# Patient Record
Sex: Female | Born: 1945 | Race: White | Hispanic: No | Marital: Single | Smoking: Former smoker
Health system: Southern US, Community
[De-identification: ages and names within clinical notes are randomized; demographics above are authoritative.]

## PROBLEM LIST (undated history)

## (undated) DIAGNOSIS — J449 Chronic obstructive pulmonary disease, unspecified: Secondary | ICD-10-CM

## (undated) DIAGNOSIS — I2510 Atherosclerotic heart disease of native coronary artery without angina pectoris: Secondary | ICD-10-CM

## (undated) DIAGNOSIS — I10 Essential (primary) hypertension: Secondary | ICD-10-CM

## (undated) DIAGNOSIS — I5020 Unspecified systolic (congestive) heart failure: Secondary | ICD-10-CM

## (undated) HISTORY — PX: PERCUTANEOUS CORONARY STENT INTERVENTION (PCI-S): SHX6016

---

## 2012-01-27 DIAGNOSIS — I619 Nontraumatic intracerebral hemorrhage, unspecified: Secondary | ICD-10-CM

## 2012-01-27 HISTORY — DX: Nontraumatic intracerebral hemorrhage, unspecified: I61.9

## 2020-10-26 DIAGNOSIS — I213 ST elevation (STEMI) myocardial infarction of unspecified site: Secondary | ICD-10-CM

## 2020-10-26 HISTORY — DX: ST elevation (STEMI) myocardial infarction of unspecified site: I21.3

## 2021-01-26 DIAGNOSIS — I480 Paroxysmal atrial fibrillation: Secondary | ICD-10-CM

## 2021-01-26 HISTORY — DX: Paroxysmal atrial fibrillation: I48.0

## 2021-02-26 DIAGNOSIS — R040 Epistaxis: Secondary | ICD-10-CM

## 2021-02-26 HISTORY — DX: Epistaxis: R04.0

## 2021-05-25 ENCOUNTER — Other Ambulatory Visit: Payer: Self-pay

## 2021-05-25 ENCOUNTER — Inpatient Hospital Stay (HOSPITAL_COMMUNITY)
Admission: EM | Admit: 2021-05-25 | Discharge: 2021-05-29 | DRG: 193 | Disposition: A | Discharge status: Home / Self Care | Payer: Medicare (Managed Care) | Attending: Internal Medicine | Admitting: Internal Medicine

## 2021-05-25 ENCOUNTER — Encounter (HOSPITAL_COMMUNITY): Payer: Self-pay | Admitting: Emergency Medicine

## 2021-05-25 ENCOUNTER — Emergency Department (HOSPITAL_COMMUNITY): Payer: Medicare (Managed Care)

## 2021-05-25 DIAGNOSIS — R5382 Chronic fatigue, unspecified: Secondary | ICD-10-CM | POA: Diagnosis present

## 2021-05-25 DIAGNOSIS — Z8673 Personal history of transient ischemic attack (TIA), and cerebral infarction without residual deficits: Secondary | ICD-10-CM

## 2021-05-25 DIAGNOSIS — Z9861 Coronary angioplasty status: Secondary | ICD-10-CM | POA: Diagnosis not present

## 2021-05-25 DIAGNOSIS — R911 Solitary pulmonary nodule: Secondary | ICD-10-CM | POA: Diagnosis present

## 2021-05-25 DIAGNOSIS — Z8249 Family history of ischemic heart disease and other diseases of the circulatory system: Secondary | ICD-10-CM

## 2021-05-25 DIAGNOSIS — Z87891 Personal history of nicotine dependence: Secondary | ICD-10-CM

## 2021-05-25 DIAGNOSIS — I2510 Atherosclerotic heart disease of native coronary artery without angina pectoris: Secondary | ICD-10-CM | POA: Diagnosis not present

## 2021-05-25 DIAGNOSIS — R0902 Hypoxemia: Secondary | ICD-10-CM | POA: Diagnosis not present

## 2021-05-25 DIAGNOSIS — J189 Pneumonia, unspecified organism: Principal | ICD-10-CM | POA: Diagnosis present

## 2021-05-25 DIAGNOSIS — E785 Hyperlipidemia, unspecified: Secondary | ICD-10-CM | POA: Diagnosis present

## 2021-05-25 DIAGNOSIS — Z79899 Other long term (current) drug therapy: Secondary | ICD-10-CM

## 2021-05-25 DIAGNOSIS — I509 Heart failure, unspecified: Principal | ICD-10-CM

## 2021-05-25 DIAGNOSIS — I252 Old myocardial infarction: Secondary | ICD-10-CM

## 2021-05-25 DIAGNOSIS — I480 Paroxysmal atrial fibrillation: Secondary | ICD-10-CM | POA: Diagnosis present

## 2021-05-25 DIAGNOSIS — I5033 Acute on chronic diastolic (congestive) heart failure: Secondary | ICD-10-CM | POA: Diagnosis present

## 2021-05-25 DIAGNOSIS — I959 Hypotension, unspecified: Secondary | ICD-10-CM | POA: Diagnosis not present

## 2021-05-25 DIAGNOSIS — I10 Essential (primary) hypertension: Secondary | ICD-10-CM | POA: Diagnosis present

## 2021-05-25 DIAGNOSIS — Z20822 Contact with and (suspected) exposure to covid-19: Secondary | ICD-10-CM | POA: Diagnosis present

## 2021-05-25 DIAGNOSIS — I429 Cardiomyopathy, unspecified: Secondary | ICD-10-CM | POA: Diagnosis present

## 2021-05-25 DIAGNOSIS — R8271 Bacteriuria: Secondary | ICD-10-CM | POA: Diagnosis present

## 2021-05-25 DIAGNOSIS — R54 Age-related physical debility: Secondary | ICD-10-CM | POA: Diagnosis present

## 2021-05-25 DIAGNOSIS — I110 Hypertensive heart disease with heart failure: Secondary | ICD-10-CM | POA: Diagnosis present

## 2021-05-25 DIAGNOSIS — Z7902 Long term (current) use of antithrombotics/antiplatelets: Secondary | ICD-10-CM

## 2021-05-25 DIAGNOSIS — Z888 Allergy status to other drugs, medicaments and biological substances status: Secondary | ICD-10-CM

## 2021-05-25 DIAGNOSIS — J440 Chronic obstructive pulmonary disease with acute lower respiratory infection: Secondary | ICD-10-CM | POA: Diagnosis present

## 2021-05-25 DIAGNOSIS — I4891 Unspecified atrial fibrillation: Secondary | ICD-10-CM

## 2021-05-25 DIAGNOSIS — J9601 Acute respiratory failure with hypoxia: Secondary | ICD-10-CM | POA: Diagnosis present

## 2021-05-25 DIAGNOSIS — Z7901 Long term (current) use of anticoagulants: Secondary | ICD-10-CM

## 2021-05-25 DIAGNOSIS — E860 Dehydration: Secondary | ICD-10-CM | POA: Diagnosis present

## 2021-05-25 DIAGNOSIS — R040 Epistaxis: Secondary | ICD-10-CM | POA: Diagnosis present

## 2021-05-25 DIAGNOSIS — Z955 Presence of coronary angioplasty implant and graft: Secondary | ICD-10-CM

## 2021-05-25 HISTORY — DX: Atherosclerotic heart disease of native coronary artery without angina pectoris: I25.10

## 2021-05-25 HISTORY — DX: Unspecified systolic (congestive) heart failure: I50.20

## 2021-05-25 HISTORY — DX: Essential (primary) hypertension: I10

## 2021-05-25 HISTORY — DX: Chronic obstructive pulmonary disease, unspecified: J44.9

## 2021-05-25 LAB — CBC
HCT: 46 % (ref 36.0–46.0)
Hemoglobin: 15.7 g/dL — ABNORMAL HIGH (ref 12.0–15.0)
MCH: 33.1 pg (ref 26.0–34.0)
MCHC: 34.1 g/dL (ref 30.0–36.0)
MCV: 97 fL (ref 80.0–100.0)
Platelets: 240 10*3/uL (ref 150–400)
RBC: 4.74 MIL/uL (ref 3.87–5.11)
RDW: 12.5 % (ref 11.5–15.5)
WBC: 16 10*3/uL — ABNORMAL HIGH (ref 4.0–10.5)
nRBC: 0 % (ref 0.0–0.2)

## 2021-05-25 LAB — URINALYSIS, ROUTINE W REFLEX MICROSCOPIC
Bilirubin Urine: NEGATIVE
Glucose, UA: NEGATIVE mg/dL
Hgb urine dipstick: NEGATIVE
Ketones, ur: NEGATIVE mg/dL
Nitrite: POSITIVE — AB
Protein, ur: 100 mg/dL — AB
Specific Gravity, Urine: 1.019 (ref 1.005–1.030)
WBC, UA: >50 WBC/hpf — ABNORMAL HIGH (ref 0–5)
pH: 8 (ref 5.0–8.0)

## 2021-05-25 LAB — I-STAT ARTERIAL BLOOD GAS, ED
Acid-base deficit: 1 mmol/L (ref 0.0–2.0)
Bicarbonate: 22.3 mmol/L (ref 20.0–28.0)
Calcium, Ion: 1.17 mmol/L (ref 1.15–1.40)
HCT: 45 % (ref 36.0–46.0)
Hemoglobin: 15.3 g/dL — ABNORMAL HIGH (ref 12.0–15.0)
O2 Saturation: 93 %
Patient temperature: 98.6
Potassium: 3.4 mmol/L — ABNORMAL LOW (ref 3.5–5.1)
Sodium: 134 mmol/L — ABNORMAL LOW (ref 135–145)
TCO2: 23 mmol/L (ref 22–32)
pCO2 arterial: 32 mmHg (ref 32–48)
pH, Arterial: 7.451 — ABNORMAL HIGH (ref 7.35–7.45)
pO2, Arterial: 62 mmHg — ABNORMAL LOW (ref 83–108)

## 2021-05-25 LAB — BRAIN NATRIURETIC PEPTIDE: B Natriuretic Peptide: 872.4 pg/mL — ABNORMAL HIGH (ref 0.0–100.0)

## 2021-05-25 LAB — TROPONIN I (HIGH SENSITIVITY)
Troponin I (High Sensitivity): 11 ng/L (ref <18)
Troponin I (High Sensitivity): 12 ng/L (ref <18)

## 2021-05-25 LAB — HEPATIC FUNCTION PANEL
ALT: 19 U/L (ref 0–44)
AST: 29 U/L (ref 15–41)
Albumin: 3.9 g/dL (ref 3.5–5.0)
Alkaline Phosphatase: 65 U/L (ref 38–126)
Bilirubin, Direct: 0.3 mg/dL — ABNORMAL HIGH (ref 0.0–0.2)
Indirect Bilirubin: 1.3 mg/dL — ABNORMAL HIGH (ref 0.3–0.9)
Total Bilirubin: 1.6 mg/dL — ABNORMAL HIGH (ref 0.3–1.2)
Total Protein: 7.1 g/dL (ref 6.5–8.1)

## 2021-05-25 LAB — BASIC METABOLIC PANEL
Anion gap: 10 (ref 5–15)
BUN: 15 mg/dL (ref 8–23)
CO2: 21 mmol/L — ABNORMAL LOW (ref 22–32)
Calcium: 9 mg/dL (ref 8.9–10.3)
Chloride: 102 mmol/L (ref 98–111)
Creatinine, Ser: 0.86 mg/dL (ref 0.44–1.00)
GFR, Estimated: >60 mL/min (ref >60)
Glucose, Bld: 170 mg/dL — ABNORMAL HIGH (ref 70–99)
Potassium: 3.8 mmol/L (ref 3.5–5.1)
Sodium: 133 mmol/L — ABNORMAL LOW (ref 135–145)

## 2021-05-25 MED ORDER — DILTIAZEM HCL-DEXTROSE 125-5 MG/125ML-% IV SOLN (PREMIX)
5.0000 mg/h | INTRAVENOUS | Status: DC
  Administered 2021-05-25 – 2021-05-26 (×2): 5 mg/h via INTRAVENOUS
  Filled 2021-05-25 (×2): qty 125

## 2021-05-25 MED ORDER — FUROSEMIDE 10 MG/ML IJ SOLN
20.0000 mg | Freq: Once | INTRAMUSCULAR | Status: AC
  Administered 2021-05-25: 20 mg via INTRAVENOUS
  Filled 2021-05-25: qty 2

## 2021-05-25 MED ORDER — SODIUM CHLORIDE 0.9 % IV SOLN
1.0000 g | INTRAVENOUS | Status: DC
  Administered 2021-05-25: 1 g via INTRAVENOUS
  Filled 2021-05-25: qty 10

## 2021-05-25 MED ORDER — SODIUM CHLORIDE 0.9 % IV SOLN: 250.0000 mL | INTRAVENOUS | Status: DC | PRN

## 2021-05-25 MED ORDER — SODIUM CHLORIDE 0.9% FLUSH: 3.0000 mL | INTRAVENOUS | Status: DC | PRN

## 2021-05-25 MED ORDER — APIXABAN 5 MG PO TABS
5.0000 mg | ORAL_TABLET | Freq: Two times a day (BID) | ORAL | Status: DC
  Administered 2021-05-26 – 2021-05-29 (×8): 5 mg via ORAL
  Filled 2021-05-25 (×8): qty 1

## 2021-05-25 MED ORDER — CLOPIDOGREL BISULFATE 75 MG PO TABS
75.0000 mg | ORAL_TABLET | Freq: Every day | ORAL | Status: DC
  Administered 2021-05-26 – 2021-05-29 (×4): 75 mg via ORAL
  Filled 2021-05-25 (×4): qty 1

## 2021-05-25 MED ORDER — ONDANSETRON HCL 4 MG/2ML IJ SOLN: 4.0000 mg | Freq: Four times a day (QID) | INTRAMUSCULAR | Status: DC | PRN

## 2021-05-25 MED ORDER — FUROSEMIDE 10 MG/ML IJ SOLN
20.0000 mg | Freq: Every day | INTRAMUSCULAR | Status: DC
  Filled 2021-05-25: qty 2

## 2021-05-25 MED ORDER — POLYVINYL ALCOHOL 1.4 % OP SOLN: 1.0000 [drp] | Freq: Two times a day (BID) | OPHTHALMIC | Status: DC | PRN

## 2021-05-25 MED ORDER — ACETAMINOPHEN 325 MG PO TABS: 650.0000 mg | ORAL_TABLET | ORAL | Status: DC | PRN

## 2021-05-25 MED ORDER — IRBESARTAN 150 MG PO TABS: 150.0000 mg | ORAL_TABLET | Freq: Two times a day (BID) | ORAL | Status: DC

## 2021-05-25 MED ORDER — IPRATROPIUM-ALBUTEROL 0.5-2.5 (3) MG/3ML IN SOLN
3.0000 mL | Freq: Once | RESPIRATORY_TRACT | Status: AC
  Administered 2021-05-25: 3 mL via RESPIRATORY_TRACT
  Filled 2021-05-25: qty 3

## 2021-05-25 MED ORDER — METHYLPREDNISOLONE SODIUM SUCC 125 MG IJ SOLR
125.0000 mg | Freq: Once | INTRAMUSCULAR | Status: AC
  Administered 2021-05-25: 125 mg via INTRAVENOUS
  Filled 2021-05-25: qty 2

## 2021-05-25 MED ORDER — METOPROLOL SUCCINATE ER 25 MG PO TB24: 100.0000 mg | ORAL_TABLET | Freq: Every day | ORAL | Status: DC

## 2021-05-25 MED ORDER — ALBUTEROL SULFATE (2.5 MG/3ML) 0.083% IN NEBU: 2.5000 mg | INHALATION_SOLUTION | Freq: Four times a day (QID) | RESPIRATORY_TRACT | Status: DC | PRN

## 2021-05-25 MED ORDER — FUROSEMIDE 10 MG/ML IJ SOLN
20.0000 mg | Freq: Once | INTRAMUSCULAR | Status: DC
Start: 2021-05-26 — End: 2021-05-26

## 2021-05-25 MED ORDER — SODIUM CHLORIDE 0.9% FLUSH
3.0000 mL | Freq: Two times a day (BID) | INTRAVENOUS | Status: DC
  Administered 2021-05-25 – 2021-05-29 (×6): 3 mL via INTRAVENOUS

## 2021-05-25 MED ORDER — DILTIAZEM HCL 25 MG/5ML IV SOLN
15.0000 mg | Freq: Once | INTRAVENOUS | Status: DC
Start: 2021-05-25 — End: 2021-05-25
  Filled 2021-05-25: qty 5

## 2021-05-25 MED ORDER — ATORVASTATIN CALCIUM 40 MG PO TABS
40.0000 mg | ORAL_TABLET | Freq: Every evening | ORAL | Status: DC
  Administered 2021-05-26 – 2021-05-28 (×3): 40 mg via ORAL
  Filled 2021-05-25 (×3): qty 1

## 2021-05-25 MED ORDER — METOPROLOL SUCCINATE ER 25 MG PO TB24: 100.0000 mg | ORAL_TABLET | Freq: Two times a day (BID) | ORAL | Status: DC

## 2021-05-25 NOTE — Assessment & Plan Note (Addendum)
DDx = CHF vs PNA, CXR neg for PNA findings though. ?Pt with h/o HFrEF following STEMI and PCI in Oct, had recovered EF to 55% with medical management as of Feb though.  Unclear if EF reduced again today. ?1. CHF pathway ?2. Lasix 20mg  IV x1 in ED, then 20mg  IV daily (increase depending on response). ?3. Checking ABG due to pt being very sleepy on exam, need to r/o hypercapnea ?4. Tele monitor ?5. 2d echo ordered ?6. Cont Losartan ?7. Cont BB ?

## 2021-05-25 NOTE — ED Notes (Signed)
The pt did not do well on the hhn  maybe half went in ?

## 2021-05-25 NOTE — H&P (Addendum)
?History and Physical  ? ? ?Patient: Jill Baldwin WLN:[id number] DOB: [birth year] ?DOA: 05/25/2021 ?DOS: the patient was seen and examined on 05/25/2021 ?PCP: System, Provider Not In  ?Patient coming from: Home ? ?Chief Complaint:  ?Chief Complaint  ?Patient presents with  ? Shortness of Breath  ? ?HPI: Jill Baldwin is a 76 y.o. female with medical history significant of ICH in 2014, COPD. ? ?Pt had STEMI in Oct 2022 -> PCI to RCA.  Initially reduced EF 35-40%. ? ?New onset A.Fib RVR in Jan 2023, put on rate control meds with eliquis. ? ?Epistaxis in Feb, remains on eliquis at this time. ? ?Pt with chronic fatigue, though her EF did recover with maximal medical therapy, most recently was 55% in Feb. ? ?Pt in to ED with c/o SOB onset today.  Sats 70s at home.  No CP.  Forgot to take her meds this AM. ? ?Has cough and fatigue for past couple of days.  Chills but no fever.  No sick contacts.  No sore throat, nasal congestion. ? ? ?Review of Systems: As mentioned in the history of present illness. All other systems reviewed and are negative. ?Past Medical History:  ?Diagnosis Date  ? CAD S/P percutaneous coronary angioplasty   ? COPD, group C, by GOLD 2017 classification (HCC)   ? Epistaxis 02/2021  ? HFrEF (heart failure with reduced ejection fraction) (HCC)   ? EF reduced after STEMI, eventually recovered to 55% as of Feb 2023  ? HTN (hypertension)   ? ICH (intracerebral hemorrhage) (HCC) 2014  ? PAF (paroxysmal atrial fibrillation) (HCC) 01/2021  ? With RVR  ? STEMI (ST elevation myocardial infarction) (HCC) 10/2020  ? PCI to RCA  ? ?Past Surgical History:  ?Procedure Laterality Date  ? PERCUTANEOUS CORONARY STENT INTERVENTION (PCI-S)    ? Oct 2022  ? ?Social History:  reports that she quit smoking about 15 years ago. Her smoking use included cigarettes. She has a 20.00 pack-year smoking history. She does not have any smokeless tobacco history on file. She reports current alcohol use of about 1.0 standard drink per week.  She reports that she does not use drugs. ? ?Allergies  ?Allergen Reactions  ? Ticagrelor Shortness Of Breath  ?  Other reaction(s): Shortness of Breath ?  ? Ace Inhibitors   ?  cough ?cough ?  ? Hydrochlorothiazide   ?  depleted all her potassium and magnesium  ?depleted all her potassium and magnesium  ?  ? ? ?History reviewed. No pertinent family history. ? ?Prior to Admission medications   ?Medication Sig Start Date End Date Taking? Authorizing Provider  ?albuterol (VENTOLIN HFA) 108 (90 Base) MCG/ACT inhaler Inhale 2 puffs into the lungs every 6 (six) hours as needed for wheezing or shortness of breath. 05/20/21  Yes [provider]  ?amLODipine (NORVASC) 5 MG tablet Take 5 mg by mouth daily. 04/30/21  Yes [provider]  ?apixaban (ELIQUIS) 5 MG TABS tablet Take 5 mg by mouth in the morning and at bedtime. 11/07/20  Yes [provider]  ?atorvastatin (LIPITOR) 40 MG tablet Take 40 mg by mouth every evening. 03/19/21  Yes [provider]  ?Carboxymethylcellulose Sodium (REFRESH TEARS OP) Place 1 drop into both eyes 2 (two) times daily as needed (dry eyes).   Yes [provider]  ?clopidogrel (PLAVIX) 75 MG tablet Take 75 mg by mouth daily. 12/06/20  Yes [provider]  ?metoprolol succinate (TOPROL-XL) 100 MG 24 hr tablet Take 100 mg by mouth  in the morning and at bedtime. 11/07/20  Yes [provider]  ?Multiple Vitamins-Minerals (CENTRUM SILVER PO) Take 1 tablet by mouth daily.   Yes [provider]  ?valsartan (DIOVAN) 160 MG tablet Take 160 mg by mouth in the morning and at bedtime. 01/24/21  Yes [provider]  ? ? ?Physical Exam: ?Vitals:  ? 05/25/21 1900 05/25/21 2044 05/25/21 2115 05/25/21 2200  ?BP: (!) 166/122  (!) 172/158 100/64  ?Pulse: 90 (!) 135 (!) 152 (!) 147  ?Resp: (!) 23 (!) 28 (!) 27 (!) 29  ?Temp:      ?TempSrc:      ?SpO2: 92% 93% 92% (!) 89%  ?Weight:      ?Height:      ? ?Constitutional: Elderly, frail ?Eyes:  PERRL, lids and conjunctivae normal ?ENMT: Mucous membranes are moist. Posterior pharynx clear of any exudate or lesions.Normal dentition.  ?Neck: normal, supple, no masses, no thyromegaly ?Respiratory: Diffuse Rhonchi and rales ?Cardiovascular: Tachycardia, irr/irr, 1+ BLE edema ?Abdomen: no tenderness, no masses palpated. No hepatosplenomegaly. Bowel sounds positive.  ?Musculoskeletal: no clubbing / cyanosis. No joint deformity upper and lower extremities. Good ROM, no contractures. Normal muscle tone.  ?Skin: no rashes, lesions, ulcers. No induration ?Neurologic: CN 2-12 grossly intact. Sensation intact, DTR normal. Strength 5/5 in all 4.  ?Psychiatric: Normal judgment and insight. Alert and oriented x 3. Normal mood.  ? ?Data Reviewed: ? ?CXR showing cardiomegaly, pulmonary vascular congestion, no focal infiltrate. ? ?WBC 16k ? ?BNP 800 ? ?Urinalysis ?   ?Component Value Date/Time  ? COLORURINE AMBER (A) 05/25/2021 1935  ? APPEARANCEUR TURBID (A) 05/25/2021 1935  ? LABSPEC 1.019 05/25/2021 1935  ? PHURINE 8.0 05/25/2021 1935  ? GLUCOSEU NEGATIVE 05/25/2021 1935  ? HGBUR NEGATIVE 05/25/2021 1935  ? BILIRUBINUR NEGATIVE 05/25/2021 1935  ? Lavenia Atlas NEGATIVE 05/25/2021 1935  ? PROTEINUR 100 (A) 05/25/2021 1935  ? NITRITE POSITIVE (A) 05/25/2021 1935  ? LEUKOCYTESUR LARGE (A) 05/25/2021 1935  ? ? ? ? ?Assessment and Plan: ?* Acute respiratory failure with hypoxia (HCC) ?DDx = CHF vs PNA, CXR neg for PNA findings though. ?Pt with h/o HFrEF following STEMI and PCI in Oct, had recovered EF to 55% with medical management as of Feb though.  Unclear if EF reduced again today. ?CHF pathway ?Lasix 20mg  IV x1 in ED, then 20mg  IV daily (increase depending on response). ?Checking ABG due to pt being very sleepy on exam, need to r/o hypercapnea ?Tele monitor ?2d echo ordered ?Cont Losartan ?Cont BB ? ?Paroxysmal atrial fibrillation with RVR (HCC) ?Cont BB - Note: does look like she is prescribed the Metoprolol 100mg  24h tab as  BID dosing, confirmed on review of her most recent cardiology office note on 2/21 ?Cont Eliquis ?Cardizem gtt for rate control ?Tele monitor ? ?CAD S/P percutaneous coronary angioplasty ?Trop neg today ?Continue plavix / eliquis ?Continue statin ? ?HTN (hypertension) ?Cont BB ?Cont Losartan ?Holding norvasc, using cardizem instead given A.Fib RVR today ? ? ? ? ? Advance Care Planning:   Code Status: Full Code ? ?Consults: None ? ?Family Communication: No family in room ? ?Severity of Illness: ?The appropriate patient status for this patient is OBSERVATION. Observation status is judged to be reasonable and necessary in order to provide the required intensity of service to ensure the patient's safety. The patient's presenting symptoms, physical exam findings, and initial radiographic and laboratory data in the context of their medical condition is felt to place them at decreased risk for further clinical  deterioration. Furthermore, it is anticipated that the patient will be medically stable for discharge from the hospital within 2 midnights of admission.  ? ?Author: ?Hillary BowGARDNER, Claudeen Leason M., DO ?05/25/2021 11:11 PM ? ?For on call review [email or web address].  ?

## 2021-05-25 NOTE — Assessment & Plan Note (Addendum)
1. Cont BB - Note: does look like she is prescribed the Metoprolol 100mg  24h tab as BID dosing, confirmed on review of her most recent cardiology office note on 2/21 ?2. Cont Eliquis ?3. Cardizem gtt for rate control ?4. Tele monitor ?

## 2021-05-25 NOTE — ED Notes (Signed)
THE PT IS C/O MUCH PAIN IN HER RT ABD SUDDENLY ?

## 2021-05-25 NOTE — ED Notes (Signed)
The opt has chronic af and she forgot to take her medicine this am ?

## 2021-05-25 NOTE — ED Notes (Signed)
The pt has intermittent  rt lateral abd pain lasting seconds ?

## 2021-05-25 NOTE — Assessment & Plan Note (Signed)
1. Cont BB ?2. Cont Losartan ?3. Holding norvasc, using cardizem instead given A.Fib RVR today ?

## 2021-05-25 NOTE — ED Notes (Signed)
Patient is on 6L O2 and oxygen saturation level is 90-91%. Notified Dr. Julian Reil. ?

## 2021-05-25 NOTE — Assessment & Plan Note (Signed)
Trop neg today ?1. Continue plavix / eliquis ?2. Continue statin ?

## 2021-05-25 NOTE — ED Provider Notes (Signed)
?[facility] ?Provider Note ? ? ?CSN: [id number] ?Arrival date & time: 05/25/21  1556 ? ?  ? ?History ? ?Chief Complaint  ?Patient presents with  ? Shortness of Breath  ? ? ?Jill Baldwin is a 76 y.o. female. ? ?HPI ? ?76 year old female presents to the emergency department with productive cough and fatigue.  Patient states has been going on for the past couple days, worse today.  Cough is productive of green phlegm.  No hemoptysis.  Was originally seen in urgent care, noted to be hypoxic.  Sent here for further evaluation.  No known sick contacts.  She endorses chills without fever.  No swelling of her lower extremities.  No active chest/back pain.  Denies any sore throat, nasal congestion, abdominal pain, vomiting/diarrhea.  She does have an inhaler that she has been using without relief. ? ?Home Medications ?Prior to Admission medications   ?Not on File  ?   ? ?Allergies    ?Patient has no known allergies.   ? ?Review of Systems   ?Review of Systems  ?Constitutional:  Positive for chills and fatigue. Negative for fever.  ?Respiratory:  Positive for cough, shortness of breath and wheezing.   ?Cardiovascular:  Negative for chest pain, palpitations and leg swelling.  ?Gastrointestinal:  Negative for abdominal pain, diarrhea and vomiting.  ?Genitourinary:  Negative for flank pain.  ?Musculoskeletal:  Negative for back pain.  ?Skin:  Negative for rash.  ?Neurological:  Negative for headaches.  ? ?Physical Exam ?Updated Vital Signs ?BP 132/84   Pulse 93   Temp 98.6 ?F (37 ?C) (Oral)   Resp 20   SpO2 100%  ?Physical Exam ?Vitals and nursing note reviewed.  ?Constitutional:   ?   Appearance: Normal appearance. She is ill-appearing.  ?HENT:  ?   Head: Normocephalic.  ?   Mouth/Throat:  ?   Mouth: Mucous membranes are moist.  ?Cardiovascular:  ?   Rate and Rhythm: Normal rate.  ?Pulmonary:  ?   Effort: Pulmonary effort is normal. Tachypnea present. No respiratory distress.  ?   Breath  sounds: Examination of the right-upper field reveals rales. Examination of the right-middle field reveals rales. Examination of the right-lower field reveals decreased breath sounds. Examination of the left-lower field reveals decreased breath sounds and rales. Decreased breath sounds, wheezing and rales present.  ?Chest:  ?   Chest wall: No crepitus.  ?Abdominal:  ?   Palpations: Abdomen is soft.  ?   Tenderness: There is no abdominal tenderness.  ?Musculoskeletal:  ?   Right lower leg: No edema.  ?   Left lower leg: No edema.  ?Skin: ?   General: Skin is warm.  ?Neurological:  ?   Mental Status: She is alert and oriented to person, place, and time. Mental status is at baseline.  ?Psychiatric:     ?   Mood and Affect: Mood normal.  ? ? ?ED Results / Procedures / Treatments   ?Labs ?(all labs ordered are listed, but only abnormal results are displayed) ?Labs Reviewed  ?BASIC METABOLIC PANEL  ?CBC  ?BRAIN NATRIURETIC PEPTIDE  ?HEPATIC FUNCTION PANEL  ?TROPONIN I (HIGH SENSITIVITY)  ? ? ?EKG ?None ? ?Radiology ?No results found. ? ?Procedures ?Marland KitchenCritical Care ?Performed by: Lorelle Gibbs, DO ?Authorized by: Lorelle Gibbs, DO  ? ?Critical care provider statement:  ?  Critical care time (minutes):  60 ?  Critical care time was exclusive of:  Separately billable procedures and treating other patients ?  Critical care was necessary to treat or prevent imminent or life-threatening deterioration of the following conditions:  Respiratory failure and cardiac failure ?  Critical care was time spent personally by me on the following activities:  Development of treatment plan with patient or surrogate, discussions with consultants, evaluation of patient's response to treatment, examination of patient, ordering and review of laboratory studies, ordering and review of radiographic studies, ordering and performing treatments and interventions, pulse oximetry, re-evaluation of patient's condition and review of old charts ?  I  assumed direction of critical care for this patient from another provider in my specialty: no   ?  Care discussed with: admitting provider    ? ? ?Medications Ordered in ED ?Medications  ?ipratropium-albuterol (DUONEB) 0.5-2.5 (3) MG/3ML nebulizer solution 3 mL (has no administration in time range)  ?methylPREDNISolone sodium succinate (SOLU-MEDROL) 125 mg/2 mL injection 125 mg (has no administration in time range)  ? ? ?ED Course/ Medical Decision Making/ A&P ?  ?                        ?Medical Decision Making ?Amount and/or Complexity of Data Reviewed ?Labs: ordered. ?Radiology: ordered. ? ?Risk ?Prescription drug management. ?Decision regarding hospitalization. ? ? ?This patient presents to the ED for concern of shortness of breath and wet cough, this involves an extensive number of treatment options, and is a complaint that carries with it a high risk of complications and morbidity.  The differential diagnosis includes infection, pneumonia, pulmonary edema, CHF.  Review of care everywhere records shows that the patient presented with CAD in October/2022 to [place] facilities.  Was found to have STEMI, stent placed in the RCA.  Diagnosed with heart failure with reduced EF at that time, was medically optimized and recent echo in February/2023 showed EF of 55 to 60%. ? ? ?Additional history obtained: ?-Additional history obtained from care everywhere records ?-External records from outside source obtained and reviewed including: Chart review including previous notes, labs, imaging, consultation notes ? ? ?Lab Tests: ?-I ordered, reviewed, and interpreted labs.  The pertinent results include: Leukocytosis of 16, normal troponin, BNP over 800 ? ? ?EKG ?-Initially was sinus rhythm on arrival, converted to atrial fibrillation during evaluation, appears to be having episodes of paroxysmal A-fib and NSR, stable blood pressure ? ? ?Imaging Studies ordered: ?-I ordered imaging studies including chest x-ray ?-I  independently visualized and interpreted imaging which showed cardiomegaly and pulmonary vascular congestion ?-I agree with the radiologist interpretation ? ? ?Medicines ordered and prescription drug management: ?-I ordered medication including DuoNeb/steroids, Lasix/Cardizem for COPD exacerbation, A-fib/heart failure ?-Reevaluation of the patient after these medicines showed that the patient improved ?-I have reviewed the patients home medicines and have made adjustments as needed ? ? ?ED Course: ?76 year old female presents emergency department with shortness of breath, hypoxia and wet cough.  Currently requiring 5 L nasal cannula, wet cough, no respiratory distress, conversational.  Chest x-ray shows cardiomegaly and findings of CHF.  She has a leukocytosis, no acute pneumonia on imaging.  Patient initially treated as a COPD exacerbation with scattered wheezes, this improved.  However work-up now reveals more of a CHF picture.  Initially she was normal sinus rhythm on arrival, has been having episodes of paroxysmal atrial fibrillation.  Has been 1 day without her medications from home which do include rate control medications.  Will give dose of medicine for A-fib/Lasix for CHF and plan for admission. ? ? ?Critical Interventions: ?IV diuretic, IV  rate control, supplemental oxygen ? ? ?Cardiac Monitoring: ?The patient was maintained on a cardiac monitor.  I personally viewed and interpreted the cardiac monitored which showed an underlying rhythm of: Normal sinus rhythm with paroxysmal atrial fibrillation, stable blood pressure ? ? ?Reevaluation: ?After the interventions noted above, I reevaluated the patient and found that they have :improved ? ? ?Dispostion: ?Patients evaluation and results requires admission for further treatment and care.  Spoke with hospitalist Dr. Alcario Drought, reviewed patient's ED course and they accept admission.  Patient agrees with admission plan, offers no new complaints and is  stable/unchanged at time of admit. ? ? ? ? ? ? ? ?Final Clinical Impression(s) / ED Diagnoses ?Final diagnoses:  ?None  ? ? ?Rx / DC Orders ?ED Discharge Orders   ? ? None  ? ?  ? ? ?  ?Lorelle Gibbs, DO ?05/25/21 212

## 2021-05-25 NOTE — ED Triage Notes (Addendum)
Pt c/o Sa02 was in 70's at home. Pt c/o SOB started today. Pt denies chest pain or any other sx. Pt a little tachypneic. Pt Sa02 is in the 70s in triage. ?

## 2021-05-26 ENCOUNTER — Observation Stay (HOSPITAL_COMMUNITY): Payer: Medicare (Managed Care)

## 2021-05-26 ENCOUNTER — Encounter (HOSPITAL_COMMUNITY): Payer: Self-pay | Admitting: Internal Medicine

## 2021-05-26 DIAGNOSIS — R911 Solitary pulmonary nodule: Secondary | ICD-10-CM | POA: Diagnosis present

## 2021-05-26 DIAGNOSIS — Z87891 Personal history of nicotine dependence: Secondary | ICD-10-CM | POA: Diagnosis not present

## 2021-05-26 DIAGNOSIS — E860 Dehydration: Secondary | ICD-10-CM | POA: Diagnosis present

## 2021-05-26 DIAGNOSIS — Z20822 Contact with and (suspected) exposure to covid-19: Secondary | ICD-10-CM | POA: Diagnosis present

## 2021-05-26 DIAGNOSIS — E785 Hyperlipidemia, unspecified: Secondary | ICD-10-CM | POA: Diagnosis present

## 2021-05-26 DIAGNOSIS — I5033 Acute on chronic diastolic (congestive) heart failure: Secondary | ICD-10-CM | POA: Diagnosis present

## 2021-05-26 DIAGNOSIS — I2510 Atherosclerotic heart disease of native coronary artery without angina pectoris: Secondary | ICD-10-CM | POA: Diagnosis present

## 2021-05-26 DIAGNOSIS — I252 Old myocardial infarction: Secondary | ICD-10-CM | POA: Diagnosis not present

## 2021-05-26 DIAGNOSIS — R0902 Hypoxemia: Secondary | ICD-10-CM | POA: Diagnosis present

## 2021-05-26 DIAGNOSIS — Z8673 Personal history of transient ischemic attack (TIA), and cerebral infarction without residual deficits: Secondary | ICD-10-CM | POA: Diagnosis not present

## 2021-05-26 DIAGNOSIS — I959 Hypotension, unspecified: Secondary | ICD-10-CM | POA: Diagnosis not present

## 2021-05-26 DIAGNOSIS — I5021 Acute systolic (congestive) heart failure: Secondary | ICD-10-CM | POA: Diagnosis not present

## 2021-05-26 DIAGNOSIS — I5023 Acute on chronic systolic (congestive) heart failure: Secondary | ICD-10-CM

## 2021-05-26 DIAGNOSIS — R8271 Bacteriuria: Secondary | ICD-10-CM | POA: Diagnosis present

## 2021-05-26 DIAGNOSIS — J189 Pneumonia, unspecified organism: Secondary | ICD-10-CM | POA: Diagnosis present

## 2021-05-26 DIAGNOSIS — I480 Paroxysmal atrial fibrillation: Secondary | ICD-10-CM | POA: Diagnosis present

## 2021-05-26 DIAGNOSIS — I110 Hypertensive heart disease with heart failure: Secondary | ICD-10-CM | POA: Diagnosis present

## 2021-05-26 DIAGNOSIS — Z7902 Long term (current) use of antithrombotics/antiplatelets: Secondary | ICD-10-CM | POA: Diagnosis not present

## 2021-05-26 DIAGNOSIS — R040 Epistaxis: Secondary | ICD-10-CM | POA: Diagnosis present

## 2021-05-26 DIAGNOSIS — I429 Cardiomyopathy, unspecified: Secondary | ICD-10-CM | POA: Diagnosis present

## 2021-05-26 DIAGNOSIS — R54 Age-related physical debility: Secondary | ICD-10-CM | POA: Diagnosis present

## 2021-05-26 DIAGNOSIS — Z955 Presence of coronary angioplasty implant and graft: Secondary | ICD-10-CM | POA: Diagnosis not present

## 2021-05-26 DIAGNOSIS — Z7901 Long term (current) use of anticoagulants: Secondary | ICD-10-CM | POA: Diagnosis not present

## 2021-05-26 DIAGNOSIS — I509 Heart failure, unspecified: Secondary | ICD-10-CM

## 2021-05-26 DIAGNOSIS — I4891 Unspecified atrial fibrillation: Secondary | ICD-10-CM

## 2021-05-26 DIAGNOSIS — Z888 Allergy status to other drugs, medicaments and biological substances status: Secondary | ICD-10-CM | POA: Diagnosis not present

## 2021-05-26 DIAGNOSIS — J9601 Acute respiratory failure with hypoxia: Secondary | ICD-10-CM | POA: Diagnosis present

## 2021-05-26 DIAGNOSIS — Z79899 Other long term (current) drug therapy: Secondary | ICD-10-CM | POA: Diagnosis not present

## 2021-05-26 DIAGNOSIS — I10 Essential (primary) hypertension: Secondary | ICD-10-CM | POA: Diagnosis not present

## 2021-05-26 DIAGNOSIS — J440 Chronic obstructive pulmonary disease with acute lower respiratory infection: Secondary | ICD-10-CM | POA: Diagnosis present

## 2021-05-26 LAB — LACTIC ACID, PLASMA
Lactic Acid, Venous: 2.5 mmol/L (ref 0.5–1.9)
Lactic Acid, Venous: 2.4 mmol/L (ref 0.5–1.9)

## 2021-05-26 LAB — BASIC METABOLIC PANEL
Anion gap: 11 (ref 5–15)
BUN: 24 mg/dL — ABNORMAL HIGH (ref 8–23)
CO2: 23 mmol/L (ref 22–32)
Calcium: 8.8 mg/dL — ABNORMAL LOW (ref 8.9–10.3)
Chloride: 100 mmol/L (ref 98–111)
Creatinine, Ser: 0.98 mg/dL (ref 0.44–1.00)
GFR, Estimated: 59 mL/min — ABNORMAL LOW (ref >60)
Glucose, Bld: 226 mg/dL — ABNORMAL HIGH (ref 70–99)
Potassium: 3.4 mmol/L — ABNORMAL LOW (ref 3.5–5.1)
Sodium: 134 mmol/L — ABNORMAL LOW (ref 135–145)

## 2021-05-26 LAB — ECHOCARDIOGRAM COMPLETE
Area-P 1/2: 4.14 cm2
Calc EF: 53.1 %
Height: 62 in
S' Lateral: 3.1 cm
Single Plane A2C EF: 54 %
Single Plane A4C EF: 50.4 %
Weight: 1952 oz

## 2021-05-26 LAB — RESP PANEL BY RT-PCR (FLU A&B, COVID) ARPGX2
Influenza A by PCR: NEGATIVE
Influenza B by PCR: NEGATIVE
SARS Coronavirus 2 by RT PCR: NEGATIVE

## 2021-05-26 LAB — MAGNESIUM: Magnesium: 1.8 mg/dL (ref 1.7–2.4)

## 2021-05-26 LAB — PROCALCITONIN: Procalcitonin: 2.34 ng/mL

## 2021-05-26 LAB — TSH: TSH: 0.617 u[IU]/mL (ref 0.350–4.500)

## 2021-05-26 LAB — MRSA NEXT GEN BY PCR, NASAL: MRSA by PCR Next Gen: NOT DETECTED

## 2021-05-26 LAB — STREP PNEUMONIAE URINARY ANTIGEN: Strep Pneumo Urinary Antigen: POSITIVE — AB

## 2021-05-26 MED ORDER — SODIUM CHLORIDE 0.9 % IV SOLN
2.0000 g | INTRAVENOUS | Status: DC
  Administered 2021-05-26 – 2021-05-28 (×3): 2 g via INTRAVENOUS
  Filled 2021-05-26 (×4): qty 20

## 2021-05-26 MED ORDER — FLUTICASONE PROPIONATE 50 MCG/ACT NA SUSP
2.0000 | Freq: Every day | NASAL | Status: DC
  Administered 2021-05-26 – 2021-05-27 (×2): 2 via NASAL
  Filled 2021-05-26: qty 16

## 2021-05-26 MED ORDER — POTASSIUM CHLORIDE CRYS ER 20 MEQ PO TBCR: 40.0000 meq | EXTENDED_RELEASE_TABLET | Freq: Once | ORAL | Status: DC

## 2021-05-26 MED ORDER — METOPROLOL TARTRATE 25 MG PO TABS
25.0000 mg | ORAL_TABLET | Freq: Two times a day (BID) | ORAL | Status: DC
  Filled 2021-05-26: qty 1

## 2021-05-26 MED ORDER — SODIUM CHLORIDE 0.9 % IV SOLN
500.0000 mg | INTRAVENOUS | Status: DC
  Administered 2021-05-26 – 2021-05-29 (×4): 500 mg via INTRAVENOUS
  Filled 2021-05-26 (×4): qty 5

## 2021-05-26 MED ORDER — BENZONATATE 100 MG PO CAPS
200.0000 mg | ORAL_CAPSULE | Freq: Three times a day (TID) | ORAL | Status: DC | PRN
  Administered 2021-05-26 – 2021-05-28 (×4): 200 mg via ORAL
  Filled 2021-05-26 (×4): qty 2

## 2021-05-26 MED ORDER — PANTOPRAZOLE SODIUM 40 MG PO TBEC
40.0000 mg | DELAYED_RELEASE_TABLET | Freq: Every day | ORAL | Status: DC
  Administered 2021-05-27 – 2021-05-29 (×3): 40 mg via ORAL
  Filled 2021-05-26 (×3): qty 1

## 2021-05-26 MED ORDER — LACTATED RINGERS IV SOLN
INTRAVENOUS | Status: DC
Start: 2021-05-26 — End: 2021-05-26

## 2021-05-26 MED ORDER — LORATADINE 10 MG PO TABS
10.0000 mg | ORAL_TABLET | Freq: Every day | ORAL | Status: DC
  Administered 2021-05-26 – 2021-05-29 (×4): 10 mg via ORAL
  Filled 2021-05-26 (×4): qty 1

## 2021-05-26 MED ORDER — OXYMETAZOLINE HCL 0.05 % NA SOLN
1.0000 | Freq: Two times a day (BID) | NASAL | Status: AC
  Administered 2021-05-26 – 2021-05-28 (×6): 1 via NASAL
  Filled 2021-05-26: qty 30

## 2021-05-26 NOTE — ED Notes (Signed)
Echocardiogram in progress at bedside.

## 2021-05-26 NOTE — Progress Notes (Signed)
Heart Failure Navigator Progress Note ? ?Assessed for Heart & Vascular TOC clinic readiness.  ?Patient does not meet criteria due to no benefit at this time..  ? ? ? ?Horace Wishon, BSN, RN ?Heart Failure Nurse Navigator ?Secure Chat Only   ?

## 2021-05-26 NOTE — Progress Notes (Signed)
?      ?                 PROGRESS NOTE ? ?      ?PATIENT DETAILS ?Name: Jill Baldwin ?Age: 76 y.o. ?Sex: female ?Date of Birth: 04/08/1945 ?Admit Date: 05/25/2021 ?Admitting Physician Etta Quill, DO ?[id number], Provider Not In ? ?Brief Summary: ?Patient is a 76 y.o.  female with history of CAD s/p PCI to RCA October 2022, PAF on Eliquis-visiting daughter in [place] (from [place])-presenting to the hospital with several days history of cough/nasal congestion (thought she had allergies) leading to SOB for the past few days.She was found to have acute hypoxic respiratory failure-thought to be due to combination of PNA, A-fib RVR and HFpEF with exacerbation. ? ?Significant events: ?4/30>> admit to [facility]-presented with hypoxia (12 L of HFNC), A-fib RVR-HFpEF exacerbation. ? ?Significant studies: ?5/1>> CT chest: PNA right middle lobe, lingula and bibasilar infiltrates. ? ?Significant microbiology data: ?5/1>> COVID/influenza PCR: Negative ? ?Procedures: ?None ? ?Consults: ?Cardiology ? ?Subjective: ?Feels better-continues to have some nasal congestion.  Acknowledges worsening exertional dyspnea for several days prior to this hospitalization. ? ?Objective: ?Vitals: ?Blood pressure 118/78, pulse 90, temperature 98.6 ?F (37 ?C), temperature source Oral, resp. rate (!) 27, height 5\' 2"  (1.575 m), weight 55.3 kg, SpO2 94 %.  ? ?Exam: ?Gen Exam:Alert awake-not in any distress ?HEENT:atraumatic, normocephalic ?Chest: B/L clear to auscultation anteriorly ?CVS:S1S2 regular ?Abdomen:soft non tender, non distended ?Extremities:no edema ?Neurology: Non focal ?Skin: no rash ? ?Pertinent Labs/Radiology: ? ?  Latest Ref Rng & Units 05/25/2021  ? 11:42 PM 05/25/2021  ?  6:15 PM  ?CBC  ?WBC 4.0 - 10.5 K/uL  16.0    ?Hemoglobin 12.0 - 15.0 g/dL 15.3   15.7    ?Hematocrit 36.0 - 46.0 % 45.0   46.0    ?Platelets 150 - 400 K/uL  240    ?  ?Lab Results  ?Component Value Date  ? NA 134 (L) 05/26/2021  ? K 3.4 (L) 05/26/2021  ? CL 100 05/26/2021  ? CO2  23 05/26/2021  ?  ? ? ?Assessment/Plan: ?Acute hypoxic respiratory failure: Suspect multifactorial etiology-likely PNA which triggered A-fib and then HFpEF exacerbation.  Was on 10 L of oxygen earlier this morning-has been titrated down to 3-4 L.  Continue supportive care and attempt to titrate down FiO2 as tolerated.  Suspect she may have some amount of chronic hypoxemia at baseline-she apparently has home O2 but she rarely uses it. ? ?CAP: Continue Rocephin/Zithromax-await culture data. ? ?PAF with RVR: In sinus rhythm-resume beta-blocker-at a lower dose as blood pressure soft-titrate off Cardizem.  Remains on Eliquis.  Echo pending-cardiology following. ? ?HFpEF exacerbation: Seems to have improved after IV Lasix given in ED when she first presented.  Volume status seems to be stable-hypoxia seems to have improved. ? ?History of CAD-s/p PCI to RCA October 2022: No chest pain-troponins negative-continue Plavix/statin/beta-blocker. ? ?HTN: BP remains soft-continue to hold losartan-resuming beta-blocker at a lower dose today.  Losartan/amlodipine on hold. ? ?COPD: Not in exacerbation-continue as needed bronchodilators.  Apparently has home O2 since she was discharged from the hospital after non-STEMI in October 2022-she is not using it. ? ?3 mm noncalcified left upper lobe lung nodule: Prior history of smoking-we will need outpatient surveillance CT scan.  Incidental finding seen on CT chest. ? ?BMI: ?Estimated body mass index is 22.31 kg/m? as calculated from the following: ?  Height as of this encounter: 5\' 2"  (1.575 m). ?  Weight as of this encounter: 55.3 kg.  ? ?Code status: ?  Code Status: Full Code  ? ?DVT Prophylaxis: ?apixaban (ELIQUIS) tablet 5 mg   ? ?Family Communication: Daughter Amy Gane-[phone]-updated on 5/1 ? ? ?Disposition Plan: ?Status is: Observation ?The patient will require care spanning > 2 midnights and should be moved to inpatient because: Severe hypoxia due to PNA/HFpEF-although  improved-not yet at baseline-still requiring around 3-4 L of oxygen.  Remains on IV antibiotics. ?  ?Planned Discharge Destination:Home ? ? ?Diet: ?Diet Order   ? ?       ?  Diet Heart Room service appropriate? Yes; Fluid consistency: Thin  Diet effective now       ?  ? ?  ?  ? ?  ?  ? ? ?Antimicrobial agents: ?Anti-infectives (From admission, onward)  ? ? Start     Dose/Rate Route Frequency Ordered Stop  ? 05/26/21 2130  cefTRIAXone (ROCEPHIN) 2 g in sodium chloride 0.9 % 100 mL IVPB       ? 2 g ?200 mL/hr over 30 Minutes Intravenous Every 24 hours 05/26/21 0645    ? 05/26/21 0600  azithromycin (ZITHROMAX) 500 mg in sodium chloride 0.9 % 250 mL IVPB       ? 500 mg ?250 mL/hr over 60 Minutes Intravenous Every 24 hours 05/26/21 0549    ? 05/25/21 2130  cefTRIAXone (ROCEPHIN) 1 g in sodium chloride 0.9 % 100 mL IVPB  Status:  Discontinued       ? 1 g ?200 mL/hr over 30 Minutes Intravenous Every 24 hours 05/25/21 2127 05/26/21 0645  ? ?  ? ? ? ?MEDICATIONS: ?Scheduled Meds: ? apixaban  5 mg Oral BID  ? atorvastatin  40 mg Oral QPM  ? clopidogrel  75 mg Oral Daily  ? fluticasone  2 spray Each Nare Daily  ? loratadine  10 mg Oral Daily  ? metoprolol tartrate  25 mg Oral BID  ? oxymetazoline  1 spray Each Nare BID  ? pantoprazole  40 mg Oral Q1200  ? sodium chloride flush  3 mL Intravenous Q12H  ? ?Continuous Infusions: ? sodium chloride    ? azithromycin 500 mg (05/26/21 0659)  ? cefTRIAXone (ROCEPHIN)  IV    ? diltiazem (CARDIZEM) infusion 5 mg/hr (05/26/21 0330)  ? ?PRN Meds:.sodium chloride, acetaminophen, albuterol, ondansetron (ZOFRAN) IV, polyvinyl alcohol, sodium chloride flush ? ? ?I have personally reviewed following labs and imaging studies ? ?LABORATORY DATA: ?CBC: ?Recent Labs  ?Lab 05/25/21 ?1815 05/25/21 ?2342  ?WBC 16.0*  --   ?HGB 15.7* 15.3*  ?HCT 46.0 45.0  ?MCV 97.0  --   ?PLT 240  --   ? ? ?Basic Metabolic Panel: ?Recent Labs  ?Lab 05/25/21 ?1815 05/25/21 ?2342 05/26/21 ?[id number]  ?NA 133* 134* 134*  ?K  3.8 3.4* 3.4*  ?CL 102  --  100  ?CO2 21*  --  23  ?GLUCOSE 170*  --  226*  ?BUN 15  --  24*  ?CREATININE 0.86  --  0.98  ?CALCIUM 9.0  --  8.8*  ? ? ?GFR: ?Estimated Creatinine Clearance: 38 mL/min (by C-G formula based on SCr of 0.98 mg/dL). ? ?Liver Function Tests: ?Recent Labs  ?Lab 05/25/21 ?1815  ?AST 29  ?ALT 19  ?ALKPHOS 65  ?BILITOT 1.6*  ?PROT 7.1  ?ALBUMIN 3.9  ? ?No results for input(s): LIPASE, AMYLASE in the last 168 hours. ?No results for input(s): AMMONIA in the last 168 hours. ? ?Coagulation Profile: ?No  results for input(s): INR, PROTIME in the last 168 hours. ? ?Cardiac Enzymes: ?No results for input(s): CKTOTAL, CKMB, CKMBINDEX, TROPONINI in the last 168 hours. ? ?BNP (last 3 results) ?No results for input(s): PROBNP in the last 8760 hours. ? ?Lipid Profile: ?No results for input(s): CHOL, HDL, LDLCALC, TRIG, CHOLHDL, LDLDIRECT in the last 72 hours. ? ?Thyroid Function Tests: ?No results for input(s): TSH, T4TOTAL, FREET4, T3FREE, THYROIDAB in the last 72 hours. ? ?Anemia Panel: ?No results for input(s): VITAMINB12, FOLATE, FERRITIN, TIBC, IRON, RETICCTPCT in the last 72 hours. ? ?Urine analysis: ?   ?Component Value Date/Time  ? COLORURINE AMBER (A) 05/25/2021 1935  ? APPEARANCEUR TURBID (A) 05/25/2021 1935  ? LABSPEC 1.019 05/25/2021 1935  ? PHURINE 8.0 05/25/2021 1935  ? [place] NEGATIVE 05/25/2021 1935  ? [place] NEGATIVE 05/25/2021 1935  ? [place] NEGATIVE 05/25/2021 1935  ? [place] Stain NEGATIVE 05/25/2021 1935  ? PROTEINUR 100 (A) 05/25/2021 1935  ? NITRITE POSITIVE (A) 05/25/2021 1935  ? LEUKOCYTESUR LARGE (A) 05/25/2021 1935  ? ? ?Sepsis Labs: ?Lactic Acid, Venous ?   ?Component Value Date/Time  ? LATICACIDVEN 2.5 ([place]) 05/26/2021 0610  ? ? ?MICROBIOLOGY: ?Recent Results (from the past 240 hour(s))  ?Resp Panel by RT-PCR (Flu A&B, Covid) Nasopharyngeal Swab     Status: None  ? Collection Time: 05/26/21  6:10 AM  ? Specimen: Nasopharyngeal Swab; Nasopharyngeal(NP) swabs in vial transport  medium  ?Result Value Ref Range Status  ? SARS Coronavirus 2 by RT PCR NEGATIVE NEGATIVE Final  ?  Comment: (NOTE) ?SARS-CoV-2 target nucleic acids are NOT DETECTED. ? ?The SARS-CoV-2 RNA is generally detect

## 2021-05-26 NOTE — Significant Event (Signed)
Pt with 10L O2 requirement and poor UOP since the 20mg  lasix: ?HR now 112 after cardizem gtt, BP 101/61, RR 24; however, O2 requirement elevated as noted. ?RN getting bladder scan ?Will get CT of chest w/o contrast to see if she may have PNA that CXR missed. ?Large PE is felt less likely given that patient is on chronic eliquis for AF.  Only missed 1 dose of eliquis.  Pt also on plavix too. ?

## 2021-05-26 NOTE — Progress Notes (Addendum)
[facility] note. ? ?CT chest shows: Moderate to marked severity right middle lobe, lingular and bibasilar atelectasis and/or infiltrate. ? ?Clinical picture is now more suspicious for PNA (CAP) as cause of her acute hypoxic resp failure than decompensated CHF.  Although pt now says she feels much better, is coughing less after the lasix a couple of hours ago.  And much more alert.  So there is still some question. ? ?BP running soft (123456 systolic).  HR now controlled in the 90s.  Still requiring 10L O2. ? ?Hold BB ?Hold losartan ?Hold lasix for now ?Continue rocephin ?Add azithromycin for CAP coverage ?Check COVID + Flu ?Check lactate ?May need fluids depending on lactate result ?Though clinically pt seems to have improved, despite the lower BPs. ?

## 2021-05-26 NOTE — Progress Notes (Signed)
Consult received. Called and spoke with nurse regarding POC. No need for 2nd PIV at this time. Tomasita Morrow, RN VAST ?

## 2021-05-26 NOTE — Consult Note (Addendum)
?Cardiology Consultation:  ? ?Patient ID: Jill SchickGail Smallman ?MRN: [id number]; DOB: Jun 04, 1945 ? ?Admit date: 05/25/2021 ?Date of Consult: 05/26/2021 ? ?PCP:  System, Provider Not In ?  ?[facility] Providers ?Cardiologist: Lives in [place]  ? ?Patient Profile:  ? ?Jill SchickGail Baldwin is a [age] y.o. 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 ? ?History of Present Illness:  ? ?Jill Baldwin is a 76 year old female with the above history who is followed by Cardiology in [place]. Patient was admitted with STEMI in 10/2020. LHC showed 80% stenosis with thrombus of ostial RCA and moderate non-obstructive disease of the LAD. She underwent successful PCI with DES to RCA lesion. Echo at that time showed LVEF of 35-40% with global mild hypokinesis and abnormal wall motion in the basal and mid inferior wall, basal inferolateral segment, and apical inferior segment. She was started on DAPT. ETT in 11/2020 showed baslien EKG changes consistent with inferolateral ischemia and T wave inversion but no significant changes with exercise to suggest significant reversible ischemia. She was then admitted in 01/2021 with new onset atrial fibrillation. Toprol-XL was increased at that time and she was started on Eliquis. Last Echo in 02/2021 showed normalization of EF to 55-60% and grade 1 diastolic dysfunction. ? ?Patient was seen in an Urgent Care on 05/25/2021 for further evaluation of productive cough and fatigue and was found to be hypoxic with O2 sats in the 70s so was sent to the ED for further evaluation. Upon arrival to the ED, she she was  hypertensive and tachypneic and was requiring a non-rebreather mask. EKG showed normal sinus rhythm with ST depressions in inferior and lateral leads. High-sensitivity troponin negative x2. BNP elevated at 872. Chest x-ray showed mild cardiac enlargement with central pulmonary vascular engorgement and probable background pulmonary emphysema as well as linear airspace disease at the left lung base which may represent atelectasis or scarring. WBC 16.0, Hgb 15.7, Plts 240. Na 133, K 3.8, Glucose 170, Bun 15, Cr 0.86. Lactic acid 2.5. Respiratory panel negative for COVID and influenza. She was started on IV Lasix and admitted. She continued to require high high flow nasal cannula  with only minimal urinary response to Lasix so chest CT was ordered and showed moderate to marked severity of right middle lobe, lingular, and bibasilar atelectasis and/or infiltrate as well as a 3 mm noncalcifired lateral left upper lung nodule. Therefore, she was started on antibiotics for pneumonia. While in the ED, she was also noted to go into rapid atrial fibrillation and was started on IV Cardizem. Cardiology was consulted for further evaluation of CHF and atrial fibrillation. ? ?At the time of this evaluation, patient is resting relatively comfortably.  She is still requiring high flow nasal cannula but states she is feeling better after the dose of IV Lasix and antibiotics. She is currently back in normal sinus rhythm with rates in the 70s. Patient lives in [place] and is down in [place] visiting her granddaughter.  She states that she was in her usual state of health until her way down here on Thursday 03/24/2021 when she started developing some upper respiratory symptoms.  She  has seasonal allergies so she thought that this was just that; however, symptoms worsened she had started to have some fatigue.  She describes nasal congestion, postnasal drip, and a wet cough.  She also reports some dyspnea with exertion over the last  few days but none at rest.  No new orthopnea or PND.  No lower extremity edema. No chest pain. She is unaware when she is in atrial fibrillation and denies any palpitations.  She notes occasional lightheadedness which she attributes to dehydration and improved with drinking fluids.  No falls or syncope.  No fevers or GI symptoms.  No abnormal bleeding on Plavix and Eliquis. ? ?Patient has a remote smoking history but quit in 2008.  She does have a family history of heart disease in her father and maternal grandmother but she was unable to provide specifics. ? ?Past Medical History:  ?Diagnosis Date  ? CAD S/P percutaneous coronary angioplasty   ? COPD, group C, by GOLD 2017 classification (HCC)   ? Epistaxis 02/2021  ? HFrEF (heart failure with reduced ejection fraction) (HCC)   ? EF reduced after STEMI, eventually recovered to 55% as of Feb 2023  ? HTN (hypertension)   ? ICH (intracerebral hemorrhage) (HCC) 2014  ? PAF (paroxysmal atrial fibrillation) (HCC) 01/2021  ? With RVR  ? STEMI (ST elevation myocardial infarction) (HCC) 10/2020  ? PCI to RCA  ? ? ?Past Surgical History:  ?Procedure Laterality Date  ? PERCUTANEOUS CORONARY STENT INTERVENTION (PCI-S)    ? Oct 2022  ?  ? ?Home Medications:  ?Prior to Admission medications   ?Medication Sig Start Date End Date Taking? Authorizing Provider  ?albuterol (VENTOLIN HFA) 108 (90 Base) MCG/ACT inhaler Inhale 2 puffs into the lungs every 6 (six) hours as needed for wheezing or shortness of breath. 05/20/21  Yes [provider]  ?amLODipine (NORVASC) 5 MG tablet Take 5 mg by mouth daily. 04/30/21  Yes [provider]  ?apixaban (ELIQUIS) 5 MG TABS tablet Take 5 mg by mouth in the morning and at bedtime. 11/07/20  Yes [provider]  ?atorvastatin (LIPITOR) 40 MG tablet Take 40 mg by mouth every evening. 03/19/21  Yes [provider]  ?Carboxymethylcellulose Sodium (REFRESH TEARS OP) Place 1 drop into both eyes 2 (two) times daily as  needed (dry eyes).   Yes [provider]  ?clopidogrel (PLAVIX) 75 MG tablet Take 75 mg by mouth daily. 12/06/20  Yes [provider]  ?metoprolol succinate (TOPROL-XL) 100 MG 24 hr tablet Take 100 mg by mouth in the morning and at bedtime. 11/07/20  Yes [provider]  ?Multiple Vitamins-Minerals (CENTRUM SILVER PO) Take 1 tablet by mouth daily.   Yes [provider]  ?valsartan (DIOVAN) 160 MG tablet Take 160 mg by mouth in the morning and at bedtime. 01/24/21  Yes [provider]  ? ? ?Inpatient Medications: ?Scheduled Meds: ? apixaban  5 mg Oral BID  ? atorvastatin  40 mg Oral QPM  ? clopidogrel  75 mg Oral Daily  ? fluticasone  2 spray Each Nare Daily  ? loratadine  10 mg Oral Daily  ? metoprolol tartrate  25 mg Oral BID  ? oxymetazoline  1 spray Each Nare BID  ? pantoprazole  40 mg Oral Q1200  ? potassium chloride  40 mEq Oral Once  ? sodium chloride flush  3 mL Intravenous Q12H  ? ?Continuous Infusions: ? sodium chloride    ? azithromycin Stopped (05/26/21 0759)  ? cefTRIAXone (ROCEPHIN)  IV    ? diltiazem (CARDIZEM) infusion 5 mg/hr (05/26/21 0330)  ? ?PRN Meds: ?sodium chloride, acetaminophen, albuterol, ondansetron (ZOFRAN) IV, polyvinyl alcohol, sodium chloride flush ? ?Allergies:    ?Allergies  ?Allergen Reactions  ? Ticagrelor Shortness Of Breath  ?  Other reaction(s): Shortness of Breath ?  ? Ace Inhibitors   ?  cough ?cough ?  ? Hydrochlorothiazide   ?  depleted all her potassium and magnesium  ?depleted all her potassium and magnesium  ?  ? ? ?Social History:   ?Social History  ? ?Socioeconomic History  ? Marital status: Single  ?  Spouse name: Not on file  ? Number of children: Not on file  ? Years of education: Not on file  ? Highest education level: Not on file  ?Occupational History  ? Not on file  ?Tobacco Use  ? Smoking status: Former  ?  Packs/day: 0.50  ?  Years: 40.00  ?  Pack years: 20.00  ?  Types: Cigarettes  ?  Quit date: 2008  ?  Years since  quitting: 15.3  ? Smokeless tobacco: Not on file  ?Substance and Sexual Activity  ? Alcohol use: Yes  ?  Alcohol/week: 1.0 standard drink  ?  Types: 1 Standard drinks or equivalent per week  ?  Comment: Social on

## 2021-05-26 NOTE — Progress Notes (Signed)
?  Echocardiogram ?2D Echocardiogram has been performed. ? Jill Baldwin ?05/26/2021, 8:07 AM ?

## 2021-05-26 NOTE — ED Notes (Signed)
Breakfast Order Placed ?

## 2021-05-26 NOTE — ED Notes (Signed)
This RN called RT to come and assess patient at bedside due to O2 saturation level being 88-89% on 6L and to place patient on salter. ?

## 2021-05-27 DIAGNOSIS — I2510 Atherosclerotic heart disease of native coronary artery without angina pectoris: Secondary | ICD-10-CM | POA: Diagnosis not present

## 2021-05-27 DIAGNOSIS — J9601 Acute respiratory failure with hypoxia: Secondary | ICD-10-CM | POA: Diagnosis not present

## 2021-05-27 DIAGNOSIS — I4891 Unspecified atrial fibrillation: Secondary | ICD-10-CM | POA: Diagnosis not present

## 2021-05-27 DIAGNOSIS — I509 Heart failure, unspecified: Secondary | ICD-10-CM | POA: Diagnosis not present

## 2021-05-27 LAB — BASIC METABOLIC PANEL
Anion gap: 8 (ref 5–15)
BUN: 32 mg/dL — ABNORMAL HIGH (ref 8–23)
CO2: 25 mmol/L (ref 22–32)
Calcium: 9 mg/dL (ref 8.9–10.3)
Chloride: 102 mmol/L (ref 98–111)
Creatinine, Ser: 0.82 mg/dL (ref 0.44–1.00)
GFR, Estimated: >60 mL/min (ref >60)
Glucose, Bld: 160 mg/dL — ABNORMAL HIGH (ref 70–99)
Potassium: 4 mmol/L (ref 3.5–5.1)
Sodium: 135 mmol/L (ref 135–145)

## 2021-05-27 LAB — LEGIONELLA PNEUMOPHILA SEROGP 1 UR AG: L. pneumophila Serogp 1 Ur Ag: NEGATIVE

## 2021-05-27 LAB — CBC
HCT: 42.8 % (ref 36.0–46.0)
Hemoglobin: 14.6 g/dL (ref 12.0–15.0)
MCH: 32.6 pg (ref 26.0–34.0)
MCHC: 34.1 g/dL (ref 30.0–36.0)
MCV: 95.5 fL (ref 80.0–100.0)
Platelets: 233 10*3/uL (ref 150–400)
RBC: 4.48 MIL/uL (ref 3.87–5.11)
RDW: 12.6 % (ref 11.5–15.5)
WBC: 23.4 10*3/uL — ABNORMAL HIGH (ref 4.0–10.5)
nRBC: 0 % (ref 0.0–0.2)

## 2021-05-27 LAB — PROCALCITONIN: Procalcitonin: 2.14 ng/mL

## 2021-05-27 MED ORDER — BUDESONIDE 0.25 MG/2ML IN SUSP
0.2500 mg | Freq: Two times a day (BID) | RESPIRATORY_TRACT | Status: DC
Start: 2021-05-27 — End: 2021-05-29
  Administered 2021-05-27 – 2021-05-29 (×4): 0.25 mg via RESPIRATORY_TRACT
  Filled 2021-05-27 (×4): qty 2

## 2021-05-27 MED ORDER — METOPROLOL SUCCINATE ER 100 MG PO TB24
100.0000 mg | ORAL_TABLET | Freq: Every day | ORAL | Status: DC
  Administered 2021-05-27: 100 mg via ORAL
  Filled 2021-05-27 (×2): qty 1

## 2021-05-27 MED ORDER — ARFORMOTEROL TARTRATE 15 MCG/2ML IN NEBU
15.0000 ug | INHALATION_SOLUTION | Freq: Two times a day (BID) | RESPIRATORY_TRACT | Status: DC
  Administered 2021-05-27 – 2021-05-29 (×4): 15 ug via RESPIRATORY_TRACT
  Filled 2021-05-27 (×4): qty 2

## 2021-05-27 NOTE — Evaluation (Signed)
Physical Therapy Evaluation ?Patient Details ?Name: Jill Baldwin ?MRN: [id number] ?DOB: [birth year] ?Today's Date: 05/27/2021 ? ?History of Present Illness ? Pt is a 76 y.o. F who presents 05/25/2021 with SOB. Found to have acute hypoxic respiratory failure, thought to be due to combination of PNA, A-fib RVR and HFpEF with exacerbation. Significant PMH: CAD s/p PCI to RCA October 2022, PAF on Eliquis  ?Clinical Impression ? PTA, pt lives alone in [place], is a limited community ambulator, and is a member of two Art gallery manager. Pt presents with decreased cardiopulmonary endurance and mild balance deficits. Pt ambulating 400 ft with no assistive device at a min guard assist level. Requires 6L O2 to maintain O2 sats > 90%. Provided with IS and pt achieving ~1250 ml. Education provided regarding activity recommendations and progression. Suspect pt will progress well. ?   ? ?Recommendations for follow up therapy are one component of a multi-disciplinary discharge planning process, led by the attending physician.  Recommendations may be updated based on patient status, additional functional criteria and insurance authorization. ? ?Follow Up Recommendations No PT follow up (pt active with cardiopulmonary rehab in [place]) ? ?  ?Assistance Recommended at Discharge PRN  ?Patient can return home with the following ? A little help with walking and/or transfers ? ?  ?Equipment Recommendations None recommended by PT  ?Recommendations for Other Services ?    ?  ?Functional Status Assessment Patient has had a recent decline in their functional status and demonstrates the ability to make significant improvements in function in a reasonable and predictable amount of time.  ? ?  ?Precautions / Restrictions Precautions ?Precautions: Fall;Other (comment) ?Precaution Comments: watch O2 ?Restrictions ?Weight Bearing Restrictions: No  ? ?  ? ?Mobility ? Bed Mobility ?Overal bed mobility: Modified Independent ?  ?  ?  ?  ?  ?  ?  ?  ? ?Transfers ?Overall  transfer level: Needs assistance ?Equipment used: None ?Transfers: Sit to/from Stand ?Sit to Stand: Min guard ?  ?  ?  ?  ?  ?  ?  ? ?Ambulation/Gait ?Ambulation/Gait assistance: Min guard ?Gait Distance (Feet): 400 Feet ?Assistive device: None ?Gait Pattern/deviations: Step-through pattern, Decreased stride length ?Gait velocity: decreased ?  ?  ?General Gait Details: Mild dynamic instability, requiring min guard assist. One standing rest break ? ?Stairs ?  ?  ?  ?  ?  ? ?Wheelchair Mobility ?  ? ?Modified Rankin (Stroke Patients Only) ?  ? ?  ? ?Balance Overall balance assessment: Mild deficits observed, not formally tested ?  ?  ?  ?  ?  ?  ?  ?  ?  ?  ?  ?  ?  ?  ?  ?  ?  ?  ?   ? ? ? ?Pertinent Vitals/Pain Pain Assessment ?Pain Assessment: No/denies pain  ? ? ?Home Living Family/patient expects to be discharged to:: Private residence ?Living Arrangements: Alone ?  ?Type of Home: House ?Home Access: Stairs to enter ?  ?Entrance Stairs-Number of Steps: 3 ?  ?Home Layout: Two level;Other (Comment) (chair lift to 2nd floor) ?Home Equipment: Pharmacist, hospital (2 wheels);Cane - single point ?   ?  ?Prior Function Prior Level of Function : Independent/Modified Independent ?  ?  ?  ?  ?  ?  ?Mobility Comments: Plays in two bowling leagues, reports now can walk 2/3 of a mile since heart attack ?  ?  ? ? ?Hand Dominance  ?   ? ?  ?Extremity/Trunk  Assessment  ? Upper Extremity Assessment ?Upper Extremity Assessment: Defer to OT evaluation ?  ? ?Lower Extremity Assessment ?Lower Extremity Assessment: Overall WFL for tasks assessed ?  ? ?Cervical / Trunk Assessment ?Cervical / Trunk Assessment: Kyphotic (mild)  ?Communication  ? Communication: No difficulties  ?Cognition Arousal/Alertness: Awake/alert ?Behavior During Therapy: [facility] for tasks assessed/performed ?Overall Cognitive Status: Within Functional Limits for tasks assessed ?  ?  ?  ?  ?  ?  ?  ?  ?  ?  ?  ?  ?  ?  ?  ?  ?  ?  ?  ? ?  ?General Comments   ? ?   ?Exercises    ? ?Assessment/Plan  ?  ?PT Assessment Patient needs continued PT services  ?PT Problem List Decreased strength;Decreased activity tolerance;Decreased balance;Decreased mobility;Cardiopulmonary status limiting activity ? ?   ?  ?PT Treatment Interventions DME instruction;Stair training;Gait training;Functional mobility training;Therapeutic activities;Therapeutic exercise;Balance training;Patient/family education   ? ?PT Goals (Current goals can be found in the Care Plan section)  ?Acute Rehab PT Goals ?Patient Stated Goal: get better ?PT Goal Formulation: With patient ?Time For Goal Achievement: 06/10/21 ?Potential to Achieve Goals: Good ? ?  ?Frequency Min 3X/week ?  ? ? ?Co-evaluation   ?  ?  ?  ?  ? ? ?  ?AM-PAC PT "6 Clicks" Mobility  ?Outcome Measure Help needed turning from your back to your side while in a flat bed without using bedrails?: None ?Help needed moving from lying on your back to sitting on the side of a flat bed without using bedrails?: None ?Help needed moving to and from a bed to a chair (including a wheelchair)?: A Little ?Help needed standing up from a chair using your arms (e.g., wheelchair or bedside chair)?: A Little ?Help needed to walk in hospital room?: A Little ?Help needed climbing 3-5 steps with a railing? : A Little ?6 Click Score: 20 ? ?  ?End of Session Equipment Utilized During Treatment: Oxygen ?Activity Tolerance: Patient tolerated treatment well ?Patient left: in bed;with call bell/phone within reach;with family/visitor present ?Nurse Communication: Mobility status ?PT Visit Diagnosis: Unsteadiness on feet (R26.81) ?  ? ?Time: 9892-1194 ?PT Time Calculation (min) (ACUTE ONLY): 29 min ? ? ?Charges:   PT Evaluation ?$PT Eval Moderate Complexity: 1 Mod ?PT Treatments ?$Therapeutic Activity: 8-22 mins ?  ?   ? ? ?Lillia Pauls, PT, DPT ?Acute Rehabilitation Services ?Pager [phone] ?Office [phone] ? ? ?Norval Morton ?05/27/2021, 4:57 PM ? ?

## 2021-05-27 NOTE — Progress Notes (Signed)
SATURATION QUALIFICATIONS: (This note is used to comply with regulatory documentation for home oxygen) ? ?Patient Saturations on Room Air at Rest = 83% ? ?Patient Saturations on Room Air while Ambulating = N/A due to desaturation at rest ? ?Patient Saturations on 6 Liters of oxygen while Ambulating =91% ? ?Please briefly explain why patient needs home oxygen: To maintain oxygen saturations > 90% at rest and with activity. ? ?Wyona Almas, PT, DPT ?Acute Rehabilitation Services ?Pager [phone] ?Office [phone] ? ?

## 2021-05-27 NOTE — Progress Notes (Signed)
? ?Progress Note ? ?Patient Name: Jill Baldwin ?Date of Encounter: 05/27/2021 ? ?[facility] Cardiologist: Lives in [place] ? ?Subjective  ? ?No CP; dyspnea improving ? ?Inpatient Medications  ?  ?Scheduled Meds: ? apixaban  5 mg Oral BID  ? atorvastatin  40 mg Oral QPM  ? clopidogrel  75 mg Oral Daily  ? fluticasone  2 spray Each Nare Daily  ? loratadine  10 mg Oral Daily  ? metoprolol tartrate  25 mg Oral BID  ? oxymetazoline  1 spray Each Nare BID  ? pantoprazole  40 mg Oral Q1200  ? potassium chloride  40 mEq Oral Once  ? sodium chloride flush  3 mL Intravenous Q12H  ? ?Continuous Infusions: ? sodium chloride    ? azithromycin 500 mg (05/27/21 0529)  ? cefTRIAXone (ROCEPHIN)  IV 2 g (05/26/21 2246)  ? diltiazem (CARDIZEM) infusion 5 mg/hr (05/27/21 0304)  ? ?PRN Meds: ?sodium chloride, acetaminophen, albuterol, benzonatate, ondansetron (ZOFRAN) IV, polyvinyl alcohol, sodium chloride flush  ? ?Vital Signs  ?  ?Vitals:  ? 05/26/21 2033 05/26/21 2315 05/27/21 0304 05/27/21 0451  ?BP: 111/78 116/63 127/67   ?Pulse: 81 63 (!) 59   ?Resp: 20 18 20    ?Temp: (!) 97.4 ?F (36.3 ?C) 97.8 ?F (36.6 ?C) (!) 97.5 ?F (36.4 ?C)   ?TempSrc: Oral Oral Oral   ?SpO2: 93% 93% 95%   ?Weight: 54.9 kg   54.9 kg  ?Height: 5\' 3"  (1.6 m)     ? ? ?Intake/Output Summary (Last 24 hours) at 05/27/2021 0739 ?Last data filed at 05/27/2021 0304 ?Gross per 24 hour  ?Intake 490.16 ml  ?Output 350 ml  ?Net 140.16 ml  ? ? ?  05/27/2021  ?  4:51 AM 05/26/2021  ?  8:33 PM 05/25/2021  ?  5:40 PM  ?Last 3 Weights  ?Weight (lbs) 121 lb 0.5 oz 121 lb 0.5 oz 122 lb  ?Weight (kg) 54.9 kg 54.9 kg 55.339 kg  ?   ? ?Telemetry  ?  ?Sinus - Personally Reviewed ? ?Physical Exam  ? ?GEN: No acute distress.   ?Neck: No JVD ?Cardiac: RRR, no murmurs, rubs, or gallops.  ?Respiratory: Diminished BS ?GI: Soft, nontender, non-distended  ?MS: No edema ?Neuro:  Nonfocal  ?Psych: Normal affect  ? ?Labs  ?  ?High Sensitivity Troponin:   ?Recent Labs  ?Lab 05/25/21 ?1815  05/25/21 ?2019  ?TROPONINIHS 11 12  ?   ?Chemistry ?Recent Labs  ?Lab 05/25/21 ?1815 05/25/21 ?2342 05/26/21 ?[id number] 05/26/21 ?1720 05/27/21 ?[id number]  ?NA 133* 134* 134*  --  135  ?K 3.8 3.4* 3.4*  --  4.0  ?CL 102  --  100  --  102  ?CO2 21*  --  23  --  25  ?GLUCOSE 170*  --  226*  --  160*  ?BUN 15  --  24*  --  32*  ?CREATININE 0.86  --  0.98  --  0.82  ?CALCIUM 9.0  --  8.8*  --  9.0  ?MG  --   --   --  1.8  --   ?PROT 7.1  --   --   --   --   ?ALBUMIN 3.9  --   --   --   --   ?AST 29  --   --   --   --   ?ALT 19  --   --   --   --   ?ALKPHOS 65  --   --   --   --   ?  BILITOT 1.6*  --   --   --   --   ?GFRNONAA >60  --  59*  --  >60  ?ANIONGAP 10  --  11  --  8  ?  ?Hematology ?Recent Labs  ?Lab 05/25/21 ?1815 05/25/21 ?2342 05/27/21 ?[id number]  ?WBC 16.0*  --  23.4*  ?RBC 4.74  --  4.48  ?HGB 15.7* 15.3* 14.6  ?HCT 46.0 45.0 42.8  ?MCV 97.0  --  95.5  ?MCH 33.1  --  32.6  ?MCHC 34.1  --  34.1  ?RDW 12.5  --  12.6  ?PLT 240  --  233  ? ?Thyroid  ?Recent Labs  ?Lab 05/26/21 ?1720  ?TSH 0.617  ?  ?BNP ?Recent Labs  ?Lab 05/25/21 ?1624  ?BNP 872.4*  ?  ? ?Radiology  ?  ?CT CHEST WO CONTRAST ? ?Result Date: 05/26/2021 ?CLINICAL DATA:  Shortness of breath. EXAM: CT CHEST WITHOUT CONTRAST TECHNIQUE: Multidetector CT imaging of the chest was performed following the standard protocol without IV contrast. RADIATION DOSE REDUCTION: This exam was performed according to the departmental dose-optimization program which includes automated exposure control, adjustment of the mA and/or kV according to patient size and/or use of iterative reconstruction technique. COMPARISON:  None. FINDINGS: Cardiovascular: There is marked severity calcification of the thoracic aorta, without evidence of aortic aneurysm. There is mild cardiomegaly with marked severity coronary artery calcification. No pericardial effusion. Mediastinum/Nodes: There is mild AP window, pretracheal and subcarinal lymphadenopathy. Thyroid gland, trachea, and esophagus  demonstrate no significant findings. 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 There is a 3 mm noncalcified lateral left upper lobe lung nodule (axial CT image 45, CT series 4). Moderate to marked severity areas of atelectasis and/or infiltrate are seen within the right middle lobe, lingular region and bilateral lung bases. There is no evidence of a pleural effusion or pneumothorax. Upper Abdomen: No acute abnormality. Musculoskeletal: Multilevel degenerative changes are seen throughout the thoracic spine. IMPRESSION: 1. Moderate to marked severity right middle lobe, lingular and bibasilar atelectasis and/or infiltrate. 2. 3 mm noncalcified lateral left upper lobe lung nodule. No follow-up needed if patient is low-risk.This recommendation follows the consensus statement: Guidelines for Management of Incidental Pulmonary Nodules Detected on CT Images: From the Fleischner Society 2017; Radiology 2017; 284:228-243. 3. Mild cardiomegaly with marked severity coronary artery calcification. Aortic Atherosclerosis (ICD10-I70.0). Electronically Signed   By: Virgina Norfolk M.D.   On: 05/26/2021 02:39  ? ?DG Chest Portable 1 View ? ?Result Date: 05/25/2021 ?CLINICAL DATA:  A 76 year old female presents with shortness of breath. EXAM: PORTABLE CHEST 1 VIEW COMPARISON:  None FINDINGS: EKG leads project over the chest. Cardiomediastinal contours and hilar structures suggest mild cardiac enlargement and central pulmonary vascular engorgement. Lungs are hyperinflated mildly. No lobar consolidation. Suggestion of background pulmonary emphysema. Linear airspace disease at the LEFT lung base. No pneumothorax. On limited assessment there is no acute skeletal process. 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 This is not well assessed. IMPRESSION: 1. Mild cardiac enlargement with central  pulmonary vascular engorgement and probable background pulmonary emphysema. 2. Linear airspace disease at the LEFT lung base may represent atelectasis or scarring. No lobar consolidation. 3. 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 Correlate with prior history of injury to this area. Electronically Signed   By: Jewel Baize.D.  On: 05/25/2021 17:09  ? ?ECHOCARDIOGRAM COMPLETE ? ?Result Date: 05/26/2021 ?   ECHOCARDIOGRAM REPORT   Patient Name:   Jill Baldwin Date of Exam: 05/26/2021 Medical Rec #:  [id number]   Height:       62.0 in Accession #:    [id number]  Weight:       122.0 lb Date of Birth:  03/18/1945   BSA:          1.549 m? Patient Age:    76 years    BP:           98/63 mmHg Patient Gender: F           HR:           80 bpm. Exam Location:  Inpatient Procedure: 2D Echo, Cardiac Doppler and Color Doppler Indications:    CHF-Acute Systolic [id number]  History:        Patient has prior history of Echocardiogram examinations, most                 recent 03/05/2021. CHF, Previous Myocardial Infarction and CAD,                 COPD and Past Intracerebral hemorrhage, Arrythmias:Atrial                 Fibrillation; Risk Factors:Former Smoker and Hypertension. Acute                 respiratory failure with hypoxia, possible pneumonia.  Sonographer:    Darlina Sicilian RDCS Referring Phys: [place]  1. Left ventricular ejection fraction, by estimation, is 55 to 60%. The left ventricle has normal function. The left ventricle has no regional wall motion abnormalities. Left ventricular diastolic function could not be evaluated.  2. Right ventricular systolic function is normal. The right ventricular size is normal. There is normal pulmonary artery systolic pressure.  3. Left atrial size was moderately dilated.  4. Right atrial size was mildly dilated.  5. The mitral valve is normal in structure. Trivial mitral valve regurgitation. No evidence of mitral stenosis.  6.  The aortic valve is tricuspid. Aortic valve regurgitation is trivial. No aortic stenosis is present.  7. The inferior vena cava is dilated in size with <50% respiratory variability, suggesting right atrial pressure of 15

## 2021-05-27 NOTE — Plan of Care (Signed)
  Problem: Education: Goal: Knowledge of General Education information will improve Description: Including pain rating scale, medication(s)/side effects and non-pharmacologic comfort measures Outcome: Progressing   Problem: Health Behavior/Discharge Planning: Goal: Ability to manage health-related needs will improve Outcome: Progressing   Problem: Clinical Measurements: Goal: Will remain free from infection Outcome: Progressing Goal: Diagnostic test results will improve Outcome: Progressing   Problem: Activity: Goal: Risk for activity intolerance will decrease Outcome: Progressing   Problem: Coping: Goal: Level of anxiety will decrease Outcome: Progressing   Problem: Safety: Goal: Ability to remain free from injury will improve Outcome: Progressing   

## 2021-05-27 NOTE — Progress Notes (Signed)
?      ?                 PROGRESS NOTE ? ?      ?PATIENT DETAILS ?Name: Jill Baldwin ?Age: 77 y.o. ?Sex: female ?Date of Birth: 1945-05-05 ?Admit Date: 05/25/2021 ?Admitting Physician Jonetta Osgood, MD ?[id number], Provider Not In ? ?Brief Summary: ?Patient is a 76 y.o.  female with history of CAD s/p PCI to RCA October 2022, PAF on Eliquis-visiting daughter in [place] (from [place])-presenting to the hospital with several days history of cough/nasal congestion (thought she had allergies) leading to SOB for the past few days.She was found to have acute hypoxic respiratory failure-thought to be due to combination of PNA, A-fib RVR and HFpEF with exacerbation. ? ?Significant events: ?4/30>> admit to [facility]-presented with hypoxia (12 L of HFNC), A-fib RVR-HFpEF exacerbation. ? ?Significant studies: ?5/1>> CT chest: PNA right middle lobe, lingula and bibasilar infiltrates. ? ?Significant microbiology data: ?4/30>> urine culture:> 100,000 colonies/mL gram-negative rods. ?5/1>> COVID/influenza PCR: Negative ?5/1>> blood culture: Pending ? ? ?Procedures: ?None ? ?Consults: ?Cardiology ? ?Subjective: ?Feels much better-maintaining sinus rhythm. ? ?Objective: ?Vitals: ?Blood pressure 107/64, pulse 72, temperature 98.7 ?F (37.1 ?C), temperature source Oral, resp. rate 18, height 5\' 3"  (1.6 m), weight 54.9 kg, SpO2 90 %.  ? ?Exam: ?Gen Exam:Alert awake-not in any distress ?HEENT:atraumatic, normocephalic ?Chest: B/L clear to auscultation anteriorly ?CVS:S1S2 regular ?Abdomen:soft non tender, non distended ?Extremities:no edema ?Neurology: Non focal ?Skin: no rash  ? ?Pertinent Labs/Radiology: ? ?  Latest Ref Rng & Units 05/27/2021  ? 12:46 AM 05/25/2021  ? 11:42 PM 05/25/2021  ?  6:15 PM  ?CBC  ?WBC 4.0 - 10.5 K/uL 23.4    16.0    ?Hemoglobin 12.0 - 15.0 g/dL 14.6   15.3   15.7    ?Hematocrit 36.0 - 46.0 % 42.8   45.0   46.0    ?Platelets 150 - 400 K/uL 233    240    ?  ?Lab Results  ?Component Value Date  ? NA 135 05/27/2021  ? K 4.0  05/27/2021  ? CL 102 05/27/2021  ? CO2 25 05/27/2021  ? ?  ? ? ?Assessment/Plan: ?Acute hypoxic respiratory failure: Likely PNA as the main culprit with some contribution from HFpEF exacerbation on just 2 L of oxygen this morning-we will attempt to titrate off oxygen.  Per patient-she does have O2 and her home in [place]-however she really does not use it.  We will attempt to titrate off oxygen-and will need to be assessed for home O2 requirement prior to discharge. ? ?CAP: Continue Rocephin/Zithromax-awaiting culture data.  Significant leukocytosis today but I suspect this was from steroids that she got when she first presented to the ED.  Clinically improved. ? ?PAF with RVR: Maintaining sinus rhythm-on metoprolol-on Eliquis. ? ?HFpEF exacerbation: Volume status has stabilized-likely triggered by A-fib RVR. ? ?History of CAD-s/p PCI to RCA October 2022: No chest pain-troponins negative-continue Plavix/statin/beta-blocker. ? ?Asymptomatic bacteriuria: No signs of UTI-in any event-on Rocephin. ? ?HTN: BP stable-on amlodipine-losartan/amlodipine on hold.   ? ?COPD: Not in exacerbation-continue as needed bronchodilators.  Apparently has home O2 since she was discharged from the hospital after non-STEMI in October 2022-she is not using it. ? ?3 mm noncalcified left upper lobe lung nodule: Prior history of smoking-we will need outpatient surveillance CT scan.  Incidental finding seen on CT chest. ? ?BMI: ?Estimated body mass index is 21.44 kg/m? as calculated from the following: ?  Height  as of this encounter: 5\' 3"  (1.6 m). ?  Weight as of this encounter: 54.9 kg.  ? ?Code status: ?  Code Status: Full Code  ? ?DVT Prophylaxis: ?apixaban (ELIQUIS) tablet 5 mg   ? ?Family Communication: Daughter Amy Sattar-[phone]-updated on 5/2 ? ? ?Disposition Plan: ?Status is: Observation ?The patient will require care spanning > 2 midnights and should be moved to inpatient because: Resolving hypoxemia due to  pneumonia-probably discharge home with home health on 5/3.  May require home O2. ?  ?Planned Discharge Destination:Home ? ? ?Diet: ?Diet Order   ? ?       ?  Diet Heart Room service appropriate? Yes; Fluid consistency: Thin  Diet effective now       ?  ? ?  ?  ? ?  ?  ? ? ?Antimicrobial agents: ?Anti-infectives (From admission, onward)  ? ? Start     Dose/Rate Route Frequency Ordered Stop  ? 05/26/21 2130  cefTRIAXone (ROCEPHIN) 2 g in sodium chloride 0.9 % 100 mL IVPB       ? 2 g ?200 mL/hr over 30 Minutes Intravenous Every 24 hours 05/26/21 0645    ? 05/26/21 0600  azithromycin (ZITHROMAX) 500 mg in sodium chloride 0.9 % 250 mL IVPB       ? 500 mg ?250 mL/hr over 60 Minutes Intravenous Every 24 hours 05/26/21 0549    ? 05/25/21 2130  cefTRIAXone (ROCEPHIN) 1 g in sodium chloride 0.9 % 100 mL IVPB  Status:  Discontinued       ? 1 g ?200 mL/hr over 30 Minutes Intravenous Every 24 hours 05/25/21 2127 05/26/21 0645  ? ?  ? ? ? ?MEDICATIONS: ?Scheduled Meds: ? apixaban  5 mg Oral BID  ? atorvastatin  40 mg Oral QPM  ? clopidogrel  75 mg Oral Daily  ? fluticasone  2 spray Each Nare Daily  ? loratadine  10 mg Oral Daily  ? metoprolol succinate  100 mg Oral Daily  ? oxymetazoline  1 spray Each Nare BID  ? pantoprazole  40 mg Oral Q1200  ? potassium chloride  40 mEq Oral Once  ? sodium chloride flush  3 mL Intravenous Q12H  ? ?Continuous Infusions: ? sodium chloride    ? azithromycin 500 mg (05/27/21 0529)  ? cefTRIAXone (ROCEPHIN)  IV 2 g (05/26/21 2246)  ? ?PRN Meds:.sodium chloride, acetaminophen, albuterol, benzonatate, ondansetron (ZOFRAN) IV, polyvinyl alcohol, sodium chloride flush ? ? ?I have personally reviewed following labs and imaging studies ? ?LABORATORY DATA: ?CBC: ?Recent Labs  ?Lab 05/25/21 ?1815 05/25/21 ?2342 05/27/21 ?[id number]  ?WBC 16.0*  --  23.4*  ?HGB 15.7* 15.3* 14.6  ?HCT 46.0 45.0 42.8  ?MCV 97.0  --  95.5  ?PLT 240  --  233  ? ? ? ?Basic Metabolic Panel: ?Recent Labs  ?Lab 05/25/21 ?1815  05/25/21 ?2342 05/26/21 ?[id number] 05/26/21 ?1720 05/27/21 ?[id number]  ?NA 133* 134* 134*  --  135  ?K 3.8 3.4* 3.4*  --  4.0  ?CL 102  --  100  --  102  ?CO2 21*  --  23  --  25  ?GLUCOSE 170*  --  226*  --  160*  ?BUN 15  --  24*  --  32*  ?CREATININE 0.86  --  0.98  --  0.82  ?CALCIUM 9.0  --  8.8*  --  9.0  ?MG  --   --   --  1.8  --   ? ? ? ?  GFR: ?Estimated Creatinine Clearance: 47.5 mL/min (by C-G formula based on SCr of 0.82 mg/dL). ? ?Liver Function Tests: ?Recent Labs  ?Lab 05/25/21 ?1815  ?AST 29  ?ALT 19  ?ALKPHOS 65  ?BILITOT 1.6*  ?PROT 7.1  ?ALBUMIN 3.9  ? ? ?No results for input(s): LIPASE, AMYLASE in the last 168 hours. ?No results for input(s): AMMONIA in the last 168 hours. ? ?Coagulation Profile: ?No results for input(s): INR, PROTIME in the last 168 hours. ? ?Cardiac Enzymes: ?No results for input(s): CKTOTAL, CKMB, CKMBINDEX, TROPONINI in the last 168 hours. ? ?BNP (last 3 results) ?No results for input(s): PROBNP in the last 8760 hours. ? ?Lipid Profile: ?No results for input(s): CHOL, HDL, LDLCALC, TRIG, CHOLHDL, LDLDIRECT in the last 72 hours. ? ?Thyroid Function Tests: ?Recent Labs  ?  05/26/21 ?1720  ?TSH 0.617  ? ? ?Anemia Panel: ?No results for input(s): VITAMINB12, FOLATE, FERRITIN, TIBC, IRON, RETICCTPCT in the last 72 hours. ? ?Urine analysis: ?   ?Component Value Date/Time  ? COLORURINE AMBER (A) 05/25/2021 1935  ? APPEARANCEUR TURBID (A) 05/25/2021 1935  ? LABSPEC 1.019 05/25/2021 1935  ? PHURINE 8.0 05/25/2021 1935  ? [place] NEGATIVE 05/25/2021 1935  ? [place] NEGATIVE 05/25/2021 1935  ? [place] NEGATIVE 05/25/2021 1935  ? [place] Stain NEGATIVE 05/25/2021 1935  ? PROTEINUR 100 (A) 05/25/2021 1935  ? NITRITE POSITIVE (A) 05/25/2021 1935  ? LEUKOCYTESUR LARGE (A) 05/25/2021 1935  ? ? ?Sepsis Labs: ?Lactic Acid, Venous ?   ?Component Value Date/Time  ? LATICACIDVEN 2.4 ([place]) 05/26/2021 1720  ? ? ?MICROBIOLOGY: ?Recent Results (from the past 240 hour(s))  ?Urine Culture     Status: Abnormal  (Preliminary result)  ? Collection Time: 05/25/21  7:35 PM  ? Specimen: Urine, Clean Catch  ?Result Value Ref Range Status  ? Specimen Description URINE, CLEAN CATCH  Final  ? Special Requests ADDED 2213  Final  ? Culture (A)  Final  ?

## 2021-05-28 DIAGNOSIS — J9601 Acute respiratory failure with hypoxia: Secondary | ICD-10-CM | POA: Diagnosis not present

## 2021-05-28 DIAGNOSIS — I4891 Unspecified atrial fibrillation: Secondary | ICD-10-CM | POA: Diagnosis not present

## 2021-05-28 DIAGNOSIS — I2510 Atherosclerotic heart disease of native coronary artery without angina pectoris: Secondary | ICD-10-CM | POA: Diagnosis not present

## 2021-05-28 DIAGNOSIS — I509 Heart failure, unspecified: Secondary | ICD-10-CM | POA: Diagnosis not present

## 2021-05-28 LAB — MAGNESIUM: Magnesium: 1.8 mg/dL (ref 1.7–2.4)

## 2021-05-28 LAB — CBC
HCT: 42.9 % (ref 36.0–46.0)
Hemoglobin: 14.4 g/dL (ref 12.0–15.0)
MCH: 32.4 pg (ref 26.0–34.0)
MCHC: 33.6 g/dL (ref 30.0–36.0)
MCV: 96.6 fL (ref 80.0–100.0)
Platelets: 264 10*3/uL (ref 150–400)
RBC: 4.44 MIL/uL (ref 3.87–5.11)
RDW: 12.7 % (ref 11.5–15.5)
WBC: 13.2 10*3/uL — ABNORMAL HIGH (ref 4.0–10.5)
nRBC: 0 % (ref 0.0–0.2)

## 2021-05-28 LAB — BASIC METABOLIC PANEL
Anion gap: 10 (ref 5–15)
BUN: 23 mg/dL (ref 8–23)
CO2: 25 mmol/L (ref 22–32)
Calcium: 8.7 mg/dL — ABNORMAL LOW (ref 8.9–10.3)
Chloride: 103 mmol/L (ref 98–111)
Creatinine, Ser: 0.83 mg/dL (ref 0.44–1.00)
GFR, Estimated: >60 mL/min (ref >60)
Glucose, Bld: 117 mg/dL — ABNORMAL HIGH (ref 70–99)
Potassium: 3.9 mmol/L (ref 3.5–5.1)
Sodium: 138 mmol/L (ref 135–145)

## 2021-05-28 LAB — URINE CULTURE: Culture: >=100000 — AB

## 2021-05-28 MED ORDER — DILTIAZEM HCL-DEXTROSE 125-5 MG/125ML-% IV SOLN (PREMIX)
5.0000 mg/h | INTRAVENOUS | Status: DC
  Administered 2021-05-28: 5 mg/h via INTRAVENOUS
  Filled 2021-05-28: qty 125

## 2021-05-28 MED ORDER — METOPROLOL TARTRATE 50 MG PO TABS
50.0000 mg | ORAL_TABLET | Freq: Two times a day (BID) | ORAL | Status: DC
  Administered 2021-05-28 (×2): 50 mg via ORAL
  Filled 2021-05-28 (×2): qty 1

## 2021-05-28 MED ORDER — POTASSIUM CHLORIDE CRYS ER 20 MEQ PO TBCR
40.0000 meq | EXTENDED_RELEASE_TABLET | Freq: Once | ORAL | Status: AC
Start: 2021-05-28 — End: 2021-05-28
  Administered 2021-05-28: 40 meq via ORAL
  Filled 2021-05-28: qty 2

## 2021-05-28 MED ORDER — MAGNESIUM SULFATE 2 GM/50ML IV SOLN
2.0000 g | Freq: Once | INTRAVENOUS | Status: AC
  Administered 2021-05-28: 2 g via INTRAVENOUS
  Filled 2021-05-28: qty 50

## 2021-05-28 MED ORDER — DILTIAZEM HCL 60 MG PO TABS
30.0000 mg | ORAL_TABLET | Freq: Four times a day (QID) | ORAL | Status: DC
  Administered 2021-05-28 – 2021-05-29 (×4): 30 mg via ORAL
  Filled 2021-05-28 (×4): qty 1

## 2021-05-28 NOTE — Evaluation (Signed)
Occupational Therapy Evaluation ?Patient Details ?Name: Jill Baldwin ?MRN: [id number] ?DOB: 23-Nov-1945 ?Today's Date: 05/28/2021 ? ? ?History of Present Illness Pt is a 76 y.o. F who presents 05/25/2021 with SOB. Found to have acute hypoxic respiratory failure, thought to be due to combination of PNA, A-fib RVR and HFpEF with exacerbation. Significant PMH: CAD s/p PCI to RCA October 2022, PAF on Eliquis  ? ?Clinical Impression ?  ?Prior to this admission, patient was completely independent in ADLs and IADLs, driving, and in two bowling leagues. Currently, patient is requiring 4L of oxygen for basic mobility, and is set up for ADLs. Patient noted to have Afib with HR up to 139 with bed mobility (non sustaining) and declining OOB due to fatigue (has been up frequently to the bathroom) however completing seated marches to increase activity tolerance. Extra time spent with regard to energy conservation strategies for safe discharge home (handout provided) and endorsed not to drive until MD clears patient. OT will follow acutely, however OT recommending patient continue with cardiac rehab once back in [place] to increase activity tolerance.  ?   ? ?Recommendations for follow up therapy are one component of a multi-disciplinary discharge planning process, led by the attending physician.  Recommendations may be updated based on patient status, additional functional criteria and insurance authorization.  ? ?Follow Up Recommendations ? No OT follow up (Continue with cardiac rehab when back in [place])  ?  ?Assistance Recommended at Discharge PRN  ?Patient can return home with the following Assist for transportation;Help with stairs or ramp for entrance;Assistance with cooking/housework;A little help with walking and/or transfers;A little help with bathing/dressing/bathroom ? ?  ?Functional Status Assessment ? Patient has had a recent decline in their functional status and demonstrates the ability to make significant improvements in function  in a reasonable and predictable amount of time.  ?Equipment Recommendations ? None recommended by OT (Patient has DME needed)  ?  ?Recommendations for Other Services   ? ? ?  ?Precautions / Restrictions Precautions ?Precautions: Fall;Other (comment) ?Precaution Comments: watch O2 and HR ?Restrictions ?Weight Bearing Restrictions: No  ? ?  ? ?Mobility Bed Mobility ?Overal bed mobility: Modified Independent ?  ?  ?  ?  ?  ?  ?General bed mobility comments: no assist needed, minimal extra time to complete ?  ? ?Transfers ?  ?  ?  ?  ?  ?  ?  ?  ?  ?General transfer comment: Politely declining transfers due to fatigue, has been ambulating back and forth from bathroom throughout the day ?  ? ?  ?Balance Overall balance assessment: Mild deficits observed, not formally tested ?  ?  ?  ?  ?  ?  ?  ?  ?  ?  ?  ?  ?  ?  ?  ?  ?  ?  ?   ? ?ADL either performed or assessed with clinical judgement  ? ?ADL Overall ADL's : Needs assistance/impaired ?Eating/Feeding: Set up;Sitting ?  ?Grooming: Set up;Sitting ?  ?Upper Body Bathing: Set up;Sitting ?  ?Lower Body Bathing: Set up;Sit to/from stand;Sitting/lateral leans ?  ?Upper Body Dressing : Set up;Sitting ?  ?Lower Body Dressing: Set up;Sitting/lateral leans;Sit to/from stand ?  ?Toilet Transfer: Min guard;Ambulation ?  ?  ?  ?  ?  ?Functional mobility during ADLs: Min guard ?General ADL Comments: Patient presenting with decreased activity tolerance, need for 4L oxygen, and Afib (up to 140 not sustaining)  ? ? ? ?Vision Baseline Vision/History:  1 Wears glasses ?Ability to See in Adequate Light: 0 Adequate ?Patient Visual Report: No change from baseline ?   ?   ?Perception   ?  ?Praxis   ?  ? ?Pertinent Vitals/Pain Pain Assessment ?Pain Assessment: No/denies pain  ? ? ? ?Hand Dominance   ?  ?Extremity/Trunk Assessment Upper Extremity Assessment ?Upper Extremity Assessment: Generalized weakness ?  ?Lower Extremity Assessment ?Lower Extremity Assessment: Defer to PT evaluation ?   ?Cervical / Trunk Assessment ?Cervical / Trunk Assessment: Kyphotic (mild) ?  ?Communication Communication ?Communication: No difficulties ?  ?Cognition Arousal/Alertness: Awake/alert ?Behavior During Therapy: [facility] for tasks assessed/performed ?Overall Cognitive Status: Within Functional Limits for tasks assessed ?  ?  ?  ?  ?  ?  ?  ?  ?  ?  ?  ?  ?  ?  ?  ?  ?General Comments: participatory and motivated ?  ?  ?General Comments    ? ?  ?Exercises Exercises: General Lower Extremity ?General Exercises - Lower Extremity ?Hip Flexion/Marching: AROM, Strengthening, Both, 10 reps (2 repetitions) ?  ?Shoulder Instructions    ? ? ?Home Living Family/patient expects to be discharged to:: Private residence ?Living Arrangements: Alone ?Available Help at Discharge: Family ?Type of Home: House ?Home Access: Stairs to enter ?Entrance Stairs-Number of Steps: 3 ?  ?Home Layout: Two level;Other (Comment) (chair lift to 2nd floor) ?  ?  ?Bathroom Shower/Tub: Tub/shower unit ?  ?  ?  ?  ?Home Equipment: Advice worker (2 wheels);Cane - single point ?  ?  ?  ? ?  ?Prior Functioning/Environment Prior Level of Function : Driving;Independent/Modified Independent ?  ?  ?  ?  ?  ?  ?Mobility Comments: Plays in two bowling leagues, reports now can walk 2/3 of a mile since heart attack ?ADLs Comments: Reports complete independence in ADLs and IADLs ?  ? ?  ?  ?OT Problem List: Decreased strength;Decreased activity tolerance;Impaired balance (sitting and/or standing);Cardiopulmonary status limiting activity ?  ?   ?OT Treatment/Interventions: Self-care/ADL training;Therapeutic exercise;Energy conservation;DME and/or AE instruction;Manual therapy;Therapeutic activities;Patient/family education;Balance training  ?  ?OT Goals(Current goals can be found in the care plan section) Acute Rehab OT Goals ?Patient Stated Goal: to get back to my normal ?OT Goal Formulation: With patient/family ?Time For Goal Achievement: 06/11/21 ?Potential  to Achieve Goals: Good  ?OT Frequency: Min 2X/week ?  ? ?Co-evaluation   ?  ?  ?  ?  ? ?  ?AM-PAC OT "6 Clicks" Daily Activity     ?Outcome Measure Help from another person eating meals?: A Little ?Help from another person taking care of personal grooming?: A Little ?Help from another person toileting, which includes using toliet, bedpan, or urinal?: A Little ?Help from another person bathing (including washing, rinsing, drying)?: A Little ?Help from another person to put on and taking off regular upper body clothing?: A Little ?Help from another person to put on and taking off regular lower body clothing?: A Little ?6 Click Score: 18 ?  ?End of Session Equipment Utilized During Treatment: Oxygen ?Nurse Communication: Mobility status;Other (comment) (HR up to 139 not sustaining) ? ?Activity Tolerance: Patient tolerated treatment well ?Patient left: in bed;with call bell/phone within reach;with family/visitor present ? ?OT Visit Diagnosis: Unsteadiness on feet (R26.81);Other abnormalities of gait and mobility (R26.89);Muscle weakness (generalized) (M62.81)  ?              ?Time: [id number] ?OT Time Calculation (min): 17 min ?Charges:  OT General Charges ?$OT  Visit: 1 Visit ?OT Evaluation ?$OT Eval Moderate Complexity: 1 Mod ? ?Corinne Ports E. Ryot Burrous, OTR/L ?Acute Rehabilitation Services ?[phone] ?[phone]  ? ?Corinne Ports Quatisha Zylka ?05/28/2021, 2:05 PM ?

## 2021-05-28 NOTE — Progress Notes (Signed)
? ?Progress Note ? ?Patient Name: Aryiana Klinkner ?Date of Encounter: 05/28/2021 ? ?[facility] Cardiologist: Lives in [place] ? ?Subjective  ? ?Mild dyspnea; no CP ? ?Inpatient Medications  ?  ?Scheduled Meds: ? apixaban  5 mg Oral BID  ? arformoterol  15 mcg Nebulization BID  ? atorvastatin  40 mg Oral QPM  ? budesonide (PULMICORT) nebulizer solution  0.25 mg Nebulization BID  ? clopidogrel  75 mg Oral Daily  ? fluticasone  2 spray Each Nare Daily  ? loratadine  10 mg Oral Daily  ? metoprolol succinate  100 mg Oral Daily  ? oxymetazoline  1 spray Each Nare BID  ? pantoprazole  40 mg Oral Q1200  ? potassium chloride  40 mEq Oral Once  ? sodium chloride flush  3 mL Intravenous Q12H  ? ?Continuous Infusions: ? sodium chloride    ? azithromycin 500 mg (05/28/21 3151)  ? cefTRIAXone (ROCEPHIN)  IV 2 g (05/27/21 2123)  ? diltiazem (CARDIZEM) infusion 5 mg/hr (05/28/21 0516)  ? ?PRN Meds: ?sodium chloride, acetaminophen, albuterol, benzonatate, ondansetron (ZOFRAN) IV, polyvinyl alcohol, sodium chloride flush  ? ?Vital Signs  ?  ?Vitals:  ? 05/28/21 0214 05/28/21 7616 05/28/21 0415 05/28/21 0515  ?BP: (!) 129/92 134/90 93/75 100/64  ?Pulse: (!) 115 (!) 120 83 89  ?Resp: 20 16 17 19   ?Temp:  97.8 ?F (36.6 ?C)    ?TempSrc:  Oral    ?SpO2: 92% 93% 94% 90%  ?Weight:  54 kg    ?Height:  5\' 2"  (1.575 m)    ? ? ?Intake/Output Summary (Last 24 hours) at 05/28/2021 0745 ?Last data filed at 05/28/2021 07/28/2021 ?Gross per 24 hour  ?Intake 376.17 ml  ?Output --  ?Net 376.17 ml  ? ? ? ?  05/28/2021  ?  3:17 AM 05/27/2021  ?  4:51 AM 05/26/2021  ?  8:33 PM  ?Last 3 Weights  ?Weight (lbs) 119 lb 0.8 oz 121 lb 0.5 oz 121 lb 0.5 oz  ?Weight (kg) 54 kg 54.9 kg 54.9 kg  ?   ? ?Telemetry  ?  ?Back in atrial fibrillation- Personally Reviewed ? ?Physical Exam  ? ?GEN: NAD  ?Neck: supple ?Cardiac: Irregular ?Respiratory: Diminished BS; no wheeze ?GI: Soft, NT/ND ?MS: No edema ?Neuro:  Grossly intact ?Psych: Normal affect  ? ?Labs  ?  ?High Sensitivity  Troponin:   ?Recent Labs  ?Lab 05/25/21 ?1815 05/25/21 ?2019  ?TROPONINIHS 11 12  ? ?   ?Chemistry ?Recent Labs  ?Lab 05/25/21 ?1815 05/25/21 ?2342 05/26/21 ?2343 05/26/21 ?1720 05/27/21 ?07/26/21 05/28/21 ?6948  ?NA 133*   < > 134*  --  135 138  ?K 3.8   < > 3.4*  --  4.0 3.9  ?CL 102  --  100  --  102 103  ?CO2 21*  --  23  --  25 25  ?GLUCOSE 170*  --  226*  --  160* 117*  ?BUN 15  --  24*  --  32* 23  ?CREATININE 0.86  --  0.98  --  0.82 0.83  ?CALCIUM 9.0  --  8.8*  --  9.0 8.7*  ?MG  --   --   --  1.8  --  1.8  ?PROT 7.1  --   --   --   --   --   ?ALBUMIN 3.9  --   --   --   --   --   ?AST 29  --   --   --   --   --   ?  ALT 19  --   --   --   --   --   ?ALKPHOS 65  --   --   --   --   --   ?BILITOT 1.6*  --   --   --   --   --   ?GFRNONAA >60  --  59*  --  >60 >60  ?ANIONGAP 10  --  11  --  8 10  ? < > = values in this interval not displayed.  ? ?  ?Hematology ?Recent Labs  ?Lab 05/25/21 ?1815 05/25/21 ?2342 05/27/21 ?[id number] 05/28/21 ?0121  ?WBC 16.0*  --  23.4* 13.2*  ?RBC 4.74  --  4.48 4.44  ?HGB 15.7* 15.3* 14.6 14.4  ?HCT 46.0 45.0 42.8 42.9  ?MCV 97.0  --  95.5 96.6  ?MCH 33.1  --  32.6 32.4  ?MCHC 34.1  --  34.1 33.6  ?RDW 12.5  --  12.6 12.7  ?PLT 240  --  233 264  ? ? ?Thyroid  ?Recent Labs  ?Lab 05/26/21 ?1720  ?TSH 0.617  ? ?  ?BNP ?Recent Labs  ?Lab 05/25/21 ?1624  ?BNP 872.4*  ? ?  ? ?Radiology  ?  ?ECHOCARDIOGRAM COMPLETE ? ?Result Date: 05/26/2021 ?   ECHOCARDIOGRAM REPORT   Patient Name:   Jules SchickGAIL Rainey Date of Exam: 05/26/2021 Medical Rec #:  [id number]   Height:       62.0 in Accession #:    [id number]  Weight:       122.0 lb Date of Birth:  1945/12/11   BSA:          1.549 m? Patient Age:    75 years    BP:           98/63 mmHg Patient Gender: F           HR:           80 bpm. Exam Location:  Inpatient Procedure: 2D Echo, Cardiac Doppler and Color Doppler Indications:    CHF-Acute Systolic I50.21  History:        Patient has prior history of Echocardiogram examinations, most                 recent  03/05/2021. CHF, Previous Myocardial Infarction and CAD,                 COPD and Past Intracerebral hemorrhage, Arrythmias:Atrial                 Fibrillation; Risk Factors:Former Smoker and Hypertension. Acute                 respiratory failure with hypoxia, possible pneumonia.  Sonographer:    Leta Junglingiffany Cooper RDCS Referring Phys: [phone] JARED M GARDNER IMPRESSIONS  1. Left ventricular ejection fraction, by estimation, is 55 to 60%. The left ventricle has normal function. The left ventricle has no regional wall motion abnormalities. Left ventricular diastolic function could not be evaluated.  2. Right ventricular systolic function is normal. The right ventricular size is normal. There is normal pulmonary artery systolic pressure.  3. Left atrial size was moderately dilated.  4. Right atrial size was mildly dilated.  5. The mitral valve is normal in structure. Trivial mitral valve regurgitation. No evidence of mitral stenosis.  6. The aortic valve is tricuspid. Aortic valve regurgitation is trivial. No aortic stenosis is present.  7. The inferior vena cava is dilated in size with <50% respiratory variability, suggesting right atrial  pressure of 15 mmHg. Comparison(s): Prior images unable to be directly viewed, comparison made by report only. No significant change from prior study. Echo at [facility] 03/05/21: EF 55-60%, abnormality of basal inferior segment, grade 1 diastolic dysfunction, RVSP 51. No severe valve disease. Conclusion(s)/Recommendation(s): Otherwise normal echocardiogram, with minor abnormalities described in the report. FINDINGS  Left Ventricle: Left ventricular ejection fraction, by estimation, is 55 to 60%. The left ventricle has normal function. The left ventricle has no regional wall motion abnormalities. The left ventricular internal cavity size was normal in size. There is  no left ventricular hypertrophy. Left ventricular diastolic function could not be evaluated due to atrial fibrillation. Left  ventricular diastolic function could not be evaluated. Normal left ventricular filling pressure. Right Ventricle: The right ventricular size is normal. No increase in right ventricular wall thickness. Right ventricular systolic function is normal. There is normal pulmonary artery systolic pressure. The tricuspid regurgitant velocity is 2.19 m/s, and  with an assumed right atrial pressure of 15 mmHg, the estimated right ventricular systolic pressure is 34.2 mmHg. Left Atrium: Left atrial size was moderately dilated. Right Atrium: Right atrial size was mildly dilated. Pericardium: There is no evidence of pericardial effusion. Mitral Valve: The mitral valve is normal in structure. Trivial mitral valve regurgitation. No evidence of mitral valve stenosis. Tricuspid Valve: The tricuspid valve is normal in structure. Tricuspid valve regurgitation is mild . No evidence of tricuspid stenosis. Aortic Valve: The aortic valve is tricuspid. Aortic valve regurgitation is trivial. No aortic stenosis is present. Pulmonic Valve: The pulmonic valve was not well visualized. Pulmonic valve regurgitation is mild. No evidence of pulmonic stenosis. Aorta: The aortic root, ascending aorta, aortic arch and descending aorta are all structurally normal, with no evidence of dilitation or obstruction. Venous: The inferior vena cava is dilated in size with less than 50% respiratory variability, suggesting right atrial pressure of 15 mmHg. IAS/Shunts: The atrial septum is grossly normal.  LEFT VENTRICLE PLAX 2D LVIDd:         4.40 cm     Diastology LVIDs:         3.10 cm     LV e' medial:    5.63 cm/s LV PW:         0.90 cm     LV E/e' medial:  11.9 LV IVS:        0.80 cm     LV e' lateral:   12.70 cm/s LVOT diam:     2.00 cm     LV E/e' lateral: 5.3 LV SV:         39 LV SV Index:   25 LVOT Area:     3.14 cm?  LV Volumes (MOD) LV vol d, MOD A2C: 72.2 ml LV vol d, MOD A4C: 73.8 ml LV vol s, MOD A2C: 33.2 ml LV vol s, MOD A4C: 36.6 ml LV SV MOD  A2C:     39.0 ml LV SV MOD A4C:     73.8 ml LV SV MOD BP:      40.0 ml RIGHT VENTRICLE RV Basal diam:  3.80 cm RV Mid diam:    3.10 cm RV S prime:     10.10 cm/s TAPSE (M-mode): 1.8 cm LEFT ATRIUM             Index

## 2021-05-28 NOTE — Progress Notes (Incomplete)
Pt's HR 120s-150s since ambulating to the bathroom, RR 23. Pt asymptomatic. Pt already had PO amio (amio gtt was already off). RN paged MD who returned call and gave verbal order to give pt 50 mg Lopressor BID. RN gave med and continuing to monitor pt. ? ?1117 pt HR in 90s to low one-teens. RN continuing to monitor ?

## 2021-05-28 NOTE — Progress Notes (Signed)
?  X-cover Note: ?Bedside RN reports back in rapid afib with HR 110-140s. ? ?Echo shows LVEF of 55-60% ?Pt on Eliquis. ? ?Will restart cardizem gtts. ?Check BMP and Mg this AM. ? ? ?Carollee Herter, DO ?[facility] ? ?

## 2021-05-28 NOTE — Progress Notes (Signed)
?      ?                 PROGRESS NOTE ? ?      ?PATIENT DETAILS ?Name: Jill Baldwin ?Age: 76 y.o. ?Sex: female ?Date of Birth: [birth year] ?Admit Date: 05/25/2021 ?Admitting Physician Jonetta Osgood, MD ?[id number], Provider Not In ? ?Brief Summary: ?Patient is a 76 y.o.  female with history of CAD s/p PCI to RCA October 2022, PAF on Eliquis-visiting daughter in [place] (from [place])-presenting to the hospital with several days history of cough/nasal congestion (thought she had allergies) leading to SOB for the past few days.She was found to have acute hypoxic respiratory failure-thought to be due to combination of PNA, A-fib RVR and HFpEF with exacerbation. ? ?Significant events: ?4/30>> admit to [facility]-presented with hypoxia (12 L of HFNC), A-fib RVR-HFpEF exacerbation and PNA.  Started on antibiotics/Cardizem gtt. ?5/1>> back in sinus rhythm ?5/3>> A-fib RVR-back on Cardizem infusion. ? ?Significant studies: ?5/1>> CT chest: PNA right middle lobe, lingula and bibasilar infiltrates. ?5/1>> Echo: EF 55-60% ? ?Significant microbiology data: ?4/30>> urine culture:> 100,000 colonies/mL gram-negative rods. ?5/1>> COVID/influenza PCR: Negative ?5/1>> blood culture: Negative ? ?Procedures: ?None ? ?Consults: ?Cardiology ? ?Subjective: ?Breathing is apparently fine but redeveloped A-fib with RVR earlier this morning.. ? ?Objective: ?Vitals: ?Blood pressure 96/69, pulse 91, temperature (!) 97.5 ?F (36.4 ?C), resp. rate 20, height 5\' 2"  (1.575 m), weight 54 kg, SpO2 96 %.  ? ?Exam: ?Gen Exam:Alert awake-not in any distress ?HEENT:atraumatic, normocephalic ?Chest: Some scattered wheezing. ?CVS:S1S2 irregular ?Abdomen:soft non tender, non distended ?Extremities:no edema ?Neurology: Non focal ?Skin: no rash  ? ?Pertinent Labs/Radiology: ? ?  Latest Ref Rng & Units 05/28/2021  ?  1:21 AM 05/27/2021  ? 12:46 AM 05/25/2021  ? 11:42 PM  ?CBC  ?WBC 4.0 - 10.5 K/uL 13.2   23.4     ?Hemoglobin 12.0 - 15.0 g/dL 14.4   14.6   15.3    ?Hematocrit 36.0  - 46.0 % 42.9   42.8   45.0    ?Platelets 150 - 400 K/uL 264   233     ?  ?Lab Results  ?Component Value Date  ? NA 138 05/28/2021  ? K 3.9 05/28/2021  ? CL 103 05/28/2021  ? CO2 25 05/28/2021  ? ?  ? ? ?Assessment/Plan: ?Acute hypoxic respiratory failure: Likely PNA as the main culprit with some contribution from HFpEF exacerbation on just 2 L of oxygen this morning-we will attempt to titrate off oxygen.  She also has COPD at baseline and apparently had this home O2 that she has not used in a while.  Continue to titrate down FiO2 as tolerated.  Remains on IV antibiotics. Will need to be assessed for home O2 requirement prior to discharge. ? ?CAP: Improving-afebrile/leukocytosis downtrending-cultures negative so far-on Rocephin/Zithromax.  ? ?PAF with RVR: Back in RVR-and on Cardizem infusion.  Remains on beta-blocker.  Eliquis being continued.  Appreciate cardiology reevaluation.   ? ?HFpEF exacerbation: Volume status has stabilized-likely triggered by A-fib RVR. ? ?History of CAD-s/p PCI to RCA October 2022: No chest pain-troponins negative-continue Plavix/statin/beta-blocker. ? ?Asymptomatic bacteriuria: No signs of UTI-in any event-on Rocephin. ? ?HTN: BP stable-on amlodipine-losartan/amlodipine on hold.   ? ?COPD: Not in exacerbation-continue as needed bronchodilators.  Apparently has home O2 since she was discharged from the hospital after non-STEMI in October 2022-she is not using it. ? ?3 mm noncalcified left upper lobe lung nodule: Prior history of smoking-we will need outpatient  surveillance CT scan.  Incidental finding seen on CT chest. ? ?BMI: ?Estimated body mass index is 21.77 kg/m? as calculated from the following: ?  Height as of this encounter: 5\' 2"  (1.575 m). ?  Weight as of this encounter: 54 kg.  ? ?Code status: ?  Code Status: Full Code  ? ?DVT Prophylaxis: ?apixaban (ELIQUIS) tablet 5 mg   ? ?Family Communication: Daughter Amy Ratterree-[phone]-updated on 5/3 ? ? ?Disposition Plan: ?Status  is: Observation ?The patient will require care spanning > 2 midnights and should be moved to inpatient because: Not stable for discharge today as she is back in A-fib RVR.  Probably discharge in the next 2 days or so if heart rate stable. ?  ?Planned Discharge Destination:Home ? ? ?Diet: ?Diet Order   ? ?       ?  Diet Heart Room service appropriate? Yes; Fluid consistency: Thin  Diet effective now       ?  ? ?  ?  ? ?  ?  ? ? ?Antimicrobial agents: ?Anti-infectives (From admission, onward)  ? ? Start     Dose/Rate Route Frequency Ordered Stop  ? 05/26/21 2130  cefTRIAXone (ROCEPHIN) 2 g in sodium chloride 0.9 % 100 mL IVPB       ? 2 g ?200 mL/hr over 30 Minutes Intravenous Every 24 hours 05/26/21 0645    ? 05/26/21 0600  azithromycin (ZITHROMAX) 500 mg in sodium chloride 0.9 % 250 mL IVPB       ? 500 mg ?250 mL/hr over 60 Minutes Intravenous Every 24 hours 05/26/21 0549    ? 05/25/21 2130  cefTRIAXone (ROCEPHIN) 1 g in sodium chloride 0.9 % 100 mL IVPB  Status:  Discontinued       ? 1 g ?200 mL/hr over 30 Minutes Intravenous Every 24 hours 05/25/21 2127 05/26/21 0645  ? ?  ? ? ? ?MEDICATIONS: ?Scheduled Meds: ? apixaban  5 mg Oral BID  ? arformoterol  15 mcg Nebulization BID  ? atorvastatin  40 mg Oral QPM  ? budesonide (PULMICORT) nebulizer solution  0.25 mg Nebulization BID  ? clopidogrel  75 mg Oral Daily  ? diltiazem  30 mg Oral Q6H  ? fluticasone  2 spray Each Nare Daily  ? loratadine  10 mg Oral Daily  ? metoprolol tartrate  50 mg Oral BID  ? oxymetazoline  1 spray Each Nare BID  ? pantoprazole  40 mg Oral Q1200  ? potassium chloride  40 mEq Oral Once  ? sodium chloride flush  3 mL Intravenous Q12H  ? ?Continuous Infusions: ? sodium chloride    ? azithromycin 500 mg (05/28/21 1761)  ? cefTRIAXone (ROCEPHIN)  IV 2 g (05/27/21 2123)  ? ?PRN Meds:.sodium chloride, acetaminophen, albuterol, benzonatate, ondansetron (ZOFRAN) IV, polyvinyl alcohol, sodium chloride flush ? ? ?I have personally reviewed following  labs and imaging studies ? ?LABORATORY DATA: ?CBC: ?Recent Labs  ?Lab 05/25/21 ?1815 05/25/21 ?2342 05/27/21 ?6073 05/28/21 ?0121  ?WBC 16.0*  --  23.4* 13.2*  ?HGB 15.7* 15.3* 14.6 14.4  ?HCT 46.0 45.0 42.8 42.9  ?MCV 97.0  --  95.5 96.6  ?PLT 240  --  233 264  ? ? ? ?Basic Metabolic Panel: ?Recent Labs  ?Lab 05/25/21 ?1815 05/25/21 ?2342 05/26/21 ?7106 05/26/21 ?1720 05/27/21 ?2694 05/28/21 ?8546  ?NA 133* 134* 134*  --  135 138  ?K 3.8 3.4* 3.4*  --  4.0 3.9  ?CL 102  --  100  --  102  103  ?CO2 21*  --  23  --  25 25  ?GLUCOSE 170*  --  226*  --  160* 117*  ?BUN 15  --  24*  --  32* 23  ?CREATININE 0.86  --  0.98  --  0.82 0.83  ?CALCIUM 9.0  --  8.8*  --  9.0 8.7*  ?MG  --   --   --  1.8  --  1.8  ? ? ? ?GFR: ?Estimated Creatinine Clearance: 44.9 mL/min (by C-G formula based on SCr of 0.83 mg/dL). ? ?Liver Function Tests: ?Recent Labs  ?Lab 05/25/21 ?1815  ?AST 29  ?ALT 19  ?ALKPHOS 65  ?BILITOT 1.6*  ?PROT 7.1  ?ALBUMIN 3.9  ? ? ?No results for input(s): LIPASE, AMYLASE in the last 168 hours. ?No results for input(s): AMMONIA in the last 168 hours. ? ?Coagulation Profile: ?No results for input(s): INR, PROTIME in the last 168 hours. ? ?Cardiac Enzymes: ?No results for input(s): CKTOTAL, CKMB, CKMBINDEX, TROPONINI in the last 168 hours. ? ?BNP (last 3 results) ?No results for input(s): PROBNP in the last 8760 hours. ? ?Lipid Profile: ?No results for input(s): CHOL, HDL, LDLCALC, TRIG, CHOLHDL, LDLDIRECT in the last 72 hours. ? ?Thyroid Function Tests: ?Recent Labs  ?  05/26/21 ?1720  ?TSH 0.617  ? ? ? ?Anemia Panel: ?No results for input(s): VITAMINB12, FOLATE, FERRITIN, TIBC, IRON, RETICCTPCT in the last 72 hours. ? ?Urine analysis: ?   ?Component Value Date/Time  ? COLORURINE AMBER (A) 05/25/2021 1935  ? APPEARANCEUR TURBID (A) 05/25/2021 1935  ? LABSPEC 1.019 05/25/2021 1935  ? PHURINE 8.0 05/25/2021 1935  ? [place] NEGATIVE 05/25/2021 1935  ? [place] NEGATIVE 05/25/2021 1935  ? [place] NEGATIVE 05/25/2021  1935  ? [place] Stain NEGATIVE 05/25/2021 1935  ? PROTEINUR 100 (A) 05/25/2021 1935  ? NITRITE POSITIVE (A) 05/25/2021 1935  ? LEUKOCYTESUR LARGE (A) 05/25/2021 1935  ? ? ?Sepsis Labs: ?Lactic Acid, Venous ?

## 2021-05-28 NOTE — TOC Progression Note (Signed)
Transition of Care (TOC) - Progression Note  ? ? ?Patient Details  ?Name: Mileigh Tilley ?MRN: [id number] ?Date of Birth: October 18, 1945 ? ?Transition of Care (TOC) CM/SW Contact  ?Beckie Busing, RN ?Phone [phone] ? ?05/28/2021, 9:53 AM ? ?Clinical Narrative:    ? ?Transition of Care (TOC) Screening Note ? ? ?Patient Details  ?Name: Judianne Seiple ?Date of Birth: 1945/03/31 ? ? ?Transition of Care (TOC) CM/SW Contact:    ?Beckie Busing, RN ?Phone Number: ?05/28/2021, 9:53 AM ? ? ? ?Transition of Care Department [facility]) has reviewed patient and no TOC needs have been identified at this time. We will continue to monitor patient advancement through interdisciplinary progression rounds. If new patient transition needs arise, please place a TOC consult. ? ? ? ? ?  ?  ? ?Expected Discharge Plan and Services ?  ?  ?  ?  ?  ?                ?  ?  ?  ?  ?  ?  ?  ?  ?  ?  ? ? ?Social Determinants of Health (SDOH) Interventions ?  ? ?Readmission Risk Interventions ?   ? View : No data to display.  ?  ?  ?  ? ? ?

## 2021-05-29 ENCOUNTER — Other Ambulatory Visit (HOSPITAL_COMMUNITY): Payer: Self-pay

## 2021-05-29 DIAGNOSIS — I2510 Atherosclerotic heart disease of native coronary artery without angina pectoris: Secondary | ICD-10-CM | POA: Diagnosis not present

## 2021-05-29 DIAGNOSIS — J9601 Acute respiratory failure with hypoxia: Secondary | ICD-10-CM | POA: Diagnosis not present

## 2021-05-29 DIAGNOSIS — I480 Paroxysmal atrial fibrillation: Secondary | ICD-10-CM | POA: Diagnosis not present

## 2021-05-29 DIAGNOSIS — I10 Essential (primary) hypertension: Secondary | ICD-10-CM | POA: Diagnosis not present

## 2021-05-29 LAB — BASIC METABOLIC PANEL
Anion gap: 8 (ref 5–15)
BUN: 14 mg/dL (ref 8–23)
CO2: 22 mmol/L (ref 22–32)
Calcium: 8.5 mg/dL — ABNORMAL LOW (ref 8.9–10.3)
Chloride: 107 mmol/L (ref 98–111)
Creatinine, Ser: 0.72 mg/dL (ref 0.44–1.00)
GFR, Estimated: >60 mL/min (ref >60)
Glucose, Bld: 144 mg/dL — ABNORMAL HIGH (ref 70–99)
Potassium: 4.6 mmol/L (ref 3.5–5.1)
Sodium: 137 mmol/L (ref 135–145)

## 2021-05-29 LAB — MAGNESIUM: Magnesium: 1.9 mg/dL (ref 1.7–2.4)

## 2021-05-29 MED ORDER — METOPROLOL SUCCINATE ER 100 MG PO TB24
100.0000 mg | ORAL_TABLET | Freq: Every day | ORAL | Status: DC
  Administered 2021-05-29: 100 mg via ORAL
  Filled 2021-05-29: qty 1

## 2021-05-29 MED ORDER — CEFDINIR 300 MG PO CAPS
300.0000 mg | ORAL_CAPSULE | Freq: Two times a day (BID) | ORAL | 0 refills | Status: AC
  Filled 2021-05-29: qty 4, 2d supply, fill #0

## 2021-05-29 MED ORDER — DILTIAZEM HCL ER COATED BEADS 120 MG PO CP24
120.0000 mg | ORAL_CAPSULE | Freq: Every day | ORAL | 0 refills | Status: AC
  Filled 2021-05-29: qty 30, 30d supply, fill #0

## 2021-05-29 MED ORDER — DILTIAZEM HCL ER COATED BEADS 120 MG PO CP24
120.0000 mg | ORAL_CAPSULE | Freq: Every day | ORAL | Status: DC
Start: 2021-05-29 — End: 2021-05-29
  Filled 2021-05-29: qty 1

## 2021-05-29 NOTE — Progress Notes (Signed)
Physical Therapy Treatment ?Patient Details ?Name: Jill Baldwin ?MRN: [id number] ?DOB: [birth year] ?Today's Date: 05/29/2021 ? ? ?History of Present Illness Pt is a 76 y.o. F who presents 05/25/2021 with SOB. Found to have acute hypoxic respiratory failure, thought to be due to combination of PNA, A-fib RVR and HFpEF with exacerbation. Significant PMH: CAD s/p PCI to RCA October 2022, PAF on Eliquis ? ?  ?PT Comments  ? ? Pt continues to require supplemental oxygen at rest and with mobility. Pt verbalizes good understanding of pacing herself with activity at home.    ?Recommendations for follow up therapy are one component of a multi-disciplinary discharge planning process, led by the attending physician.  Recommendations may be updated based on patient status, additional functional criteria and insurance authorization. ? ?Follow Up Recommendations ? No PT follow up (pt active with cardiopulmonary rehab in [place]) ?  ?  ?Assistance Recommended at Discharge PRN  ?Patient can return home with the following A little help with walking and/or transfers ?  ?Equipment Recommendations ? None recommended by PT  ?  ?Recommendations for Other Services   ? ? ?  ?Precautions / Restrictions Precautions ?Precautions: Fall;Other (comment) ?Precaution Comments: watch O2 ?Restrictions ?Weight Bearing Restrictions: No  ?  ? ?Mobility ? Bed Mobility ?Overal bed mobility: Modified Independent ?  ?  ?  ?  ?  ?  ?  ?  ? ?Transfers ?Overall transfer level: Modified independent ?Equipment used: None ?Transfers: Sit to/from Stand ?Sit to Stand: Modified independent (Device/Increase time) ?  ?  ?  ?  ?  ?  ?  ? ?Ambulation/Gait ?Ambulation/Gait assistance: Min guard ?Gait Distance (Feet): 350 Feet ?Assistive device: None ?Gait Pattern/deviations: Step-through pattern, Decreased stride length ?Gait velocity: decreased ?Gait velocity interpretation: 1.31 - 2.62 ft/sec, indicative of limited community ambulator ?  ?General Gait Details: assist for safety  due to mild instability. ? ? ?Stairs ?  ?  ?  ?  ?  ? ? ?Wheelchair Mobility ?  ? ?Modified Rankin (Stroke Patients Only) ?  ? ? ?  ?Balance Overall balance assessment: Mild deficits observed, not formally tested ?  ?  ?  ?  ?  ?  ?  ?  ?  ?  ?  ?  ?  ?  ?  ?  ?  ?  ?  ? ?  ?Cognition Arousal/Alertness: Awake/alert ?Behavior During Therapy: [facility] for tasks assessed/performed ?Overall Cognitive Status: Within Functional Limits for tasks assessed ?  ?  ?  ?  ?  ?  ?  ?  ?  ?  ?  ?  ?  ?  ?  ?  ?  ?  ?  ? ?  ?Exercises General Exercises - Lower Extremity ?Hip Flexion/Marching: AROM, Both, 10 reps, Standing ? ?  ?General Comments General comments (skin integrity, edema, etc.): Pt required 4L of O2 for mobility ?  ?  ? ?Pertinent Vitals/Pain Pain Assessment ?Pain Assessment: No/denies pain  ? ? ?Home Living   ?Living Arrangements: Alone ?  ?  ?  ?  ?  ?  ?  ?  ?   ?  ?Prior Function    ?  ?  ?   ? ?PT Goals (current goals can now be found in the care plan section) Acute Rehab PT Goals ?Patient Stated Goal: get better ?Progress towards PT goals: Progressing toward goals ? ?  ?Frequency ? ? ? Min 3X/week ? ? ? ?  ?PT Plan Current plan  remains appropriate  ? ? ?Co-evaluation   ?  ?  ?  ?  ? ?  ?AM-PAC PT "6 Clicks" Mobility   ?Outcome Measure ? Help needed turning from your back to your side while in a flat bed without using bedrails?: None ?Help needed moving from lying on your back to sitting on the side of a flat bed without using bedrails?: None ?Help needed moving to and from a bed to a chair (including a wheelchair)?: A Little ?Help needed standing up from a chair using your arms (e.g., wheelchair or bedside chair)?: A Little ?Help needed to walk in hospital room?: A Little ?Help needed climbing 3-5 steps with a railing? : A Little ?6 Click Score: 20 ? ?  ?End of Session Equipment Utilized During Treatment: Oxygen ?Activity Tolerance: Patient tolerated treatment well ?Patient left: in bed;with call bell/phone within  reach (Sitting EOB) ?Nurse Communication: Mobility status ?PT Visit Diagnosis: Unsteadiness on feet (R26.81) ?  ? ? ?Time: [id number] ?PT Time Calculation (min) (ACUTE ONLY): 18 min ? ?Charges:  $Gait Training: 8-22 mins          ?          ? ?[facility] PT ?Acute Rehabilitation Services ?Office [phone] ? ? ? ?Shary Decamp [facility] ?05/29/2021, 2:33 PM ? ?

## 2021-05-29 NOTE — Care Management Important Message (Signed)
Important Message ? ?Patient Details  ?Name: Jill Baldwin ?MRN: [id number] ?Date of Birth: August 14, 1945 ? ? ?Medicare Important Message Given:  Yes ? ? ? ? ?[name] ?05/29/2021, 2:56 PM ?

## 2021-05-29 NOTE — TOC Progression Note (Addendum)
Transition of Care (TOC) - Progression Note  ? ? ?Patient Details  ?Name: Jill Baldwin ?MRN: [id number] ?Date of Birth: December 22, 1945 ? ?Transition of Care (TOC) CM/SW Contact  ?Beckie Busing, RN ?Phone [phone] ? ?05/29/2021, 11:42 AM ? ?Clinical Narrative:    ?TOC consulted for home O2 needs. CM at bedside. Patient states that she is from [place] and has home O2 in [place]. Patient states that she traveled to [facility] without O2 because she generally doesn't need it at home. Patient is unable to recall O2 provider. She states that she thinks the name starts with an A but is not sure. CM to reach out to DME companies locally to determine options.  ? ?1154 CM spoke with Ian Malkin at [facility]. Ian Malkin is checking to see if patient is active with [employer]. ? ?1240 Cm received call from [facility] with [employer]. Per Ian Malkin patients information has been found in the system and [employer] is able to provide patient with a portable oxygen concentrator that will allow patient to use O2 at granddaughters home and allow portability for travel back to [place]. Tank you be delivered to bedside . No other needs noted at this time. TOC will sign off.  ? ?  ?  ? ?Expected Discharge Plan and Services ?  ?  ?  ?  ?  ?Expected Discharge Date: 05/29/21               ?  ?  ?  ?  ?  ?  ?  ?  ?  ?  ? ? ?Social Determinants of Health (SDOH) Interventions ?  ? ?Readmission Risk Interventions ?   ? View : No data to display.  ?  ?  ?  ? ? ?

## 2021-05-29 NOTE — Progress Notes (Signed)
? ?Progress Note ? ?Patient Name: Jill Baldwin ?Date of Encounter: 05/29/2021 ? ?[facility] Cardiologist: Lives in [place] ? ?Subjective  ? ?Pt denies CP or dyspnea ? ?Inpatient Medications  ?  ?Scheduled Meds: ? apixaban  5 mg Oral BID  ? arformoterol  15 mcg Nebulization BID  ? atorvastatin  40 mg Oral QPM  ? budesonide (PULMICORT) nebulizer solution  0.25 mg Nebulization BID  ? clopidogrel  75 mg Oral Daily  ? diltiazem  30 mg Oral Q6H  ? fluticasone  2 spray Each Nare Daily  ? loratadine  10 mg Oral Daily  ? metoprolol tartrate  50 mg Oral BID  ? pantoprazole  40 mg Oral Q1200  ? sodium chloride flush  3 mL Intravenous Q12H  ? ?Continuous Infusions: ? sodium chloride    ? azithromycin 500 mg (05/29/21 0555)  ? cefTRIAXone (ROCEPHIN)  IV 2 g (05/28/21 2126)  ? ?PRN Meds: ?sodium chloride, acetaminophen, albuterol, benzonatate, ondansetron (ZOFRAN) IV, polyvinyl alcohol, sodium chloride flush  ? ?Vital Signs  ?  ?Vitals:  ? 05/28/21 2345 05/29/21 0300 05/29/21 0644 05/29/21 0721  ?BP: 137/72 (!) 145/71  (!) 141/79  ?Pulse:  63  75  ?Resp:  20  15  ?Temp:  98.5 ?F (36.9 ?C)  97.9 ?F (36.6 ?C)  ?TempSrc:  Oral  Oral  ?SpO2:  91%  92%  ?Weight:   54.5 kg   ?Height:      ? ? ?Intake/Output Summary (Last 24 hours) at 05/29/2021 0747 ?Last data filed at 05/29/2021 0700 ?Gross per 24 hour  ?Intake 1664.07 ml  ?Output --  ?Net 1664.07 ml  ? ? ? ?  05/29/2021  ?  6:44 AM 05/28/2021  ?  3:17 AM 05/27/2021  ?  4:51 AM  ?Last 3 Weights  ?Weight (lbs) 120 lb 2.4 oz 119 lb 0.8 oz 121 lb 0.5 oz  ?Weight (kg) 54.5 kg 54 kg 54.9 kg  ?   ? ?Telemetry  ?  ?Atrial fibrillation converting to NSR- Personally Reviewed ? ?Physical Exam  ? ?GEN: NAD WD ?Neck: supple, no JVD ?Cardiac: RRR ?Respiratory: Mild rhonchi ?GI: Soft, NT/ND, no masses ?MS: No edema ?Neuro:  No focal findings ?Psych: Normal affect  ? ?Labs  ?  ?High Sensitivity Troponin:   ?Recent Labs  ?Lab 05/25/21 ?1815 05/25/21 ?2019  ?TROPONINIHS 11 12  ? ?   ?Chemistry ?Recent  Labs  ?Lab 05/25/21 ?1815 05/25/21 ?2342 05/26/21 ?1720 05/27/21 ?8242 05/28/21 ?3536 05/29/21 ?1443  ?NA 133*   < >  --  135 138 137  ?K 3.8   < >  --  4.0 3.9 4.6  ?CL 102   < >  --  102 103 107  ?CO2 21*   < >  --  25 25 22   ?GLUCOSE 170*   < >  --  160* 117* 144*  ?BUN 15   < >  --  32* 23 14  ?CREATININE 0.86   < >  --  0.82 0.83 0.72  ?CALCIUM 9.0   < >  --  9.0 8.7* 8.5*  ?MG  --   --  1.8  --  1.8 1.9  ?PROT 7.1  --   --   --   --   --   ?ALBUMIN 3.9  --   --   --   --   --   ?AST 29  --   --   --   --   --   ?  ALT 19  --   --   --   --   --   ?ALKPHOS 65  --   --   --   --   --   ?BILITOT 1.6*  --   --   --   --   --   ?GFRNONAA >60   < >  --  >60 >60 >60  ?ANIONGAP 10   < >  --  8 10 8   ? < > = values in this interval not displayed.  ? ?  ?Hematology ?Recent Labs  ?Lab 05/25/21 ?1815 05/25/21 ?2342 05/27/21 ?07/27/21 05/28/21 ?0121  ?WBC 16.0*  --  23.4* 13.2*  ?RBC 4.74  --  4.48 4.44  ?HGB 15.7* 15.3* 14.6 14.4  ?HCT 46.0 45.0 42.8 42.9  ?MCV 97.0  --  95.5 96.6  ?MCH 33.1  --  32.6 32.4  ?MCHC 34.1  --  34.1 33.6  ?RDW 12.5  --  12.6 12.7  ?PLT 240  --  233 264  ? ? ?Thyroid  ?Recent Labs  ?Lab 05/26/21 ?1720  ?TSH 0.617  ? ?  ?BNP ?Recent Labs  ?Lab 05/25/21 ?1624  ?BNP 872.4*  ? ? ? ? ? ?Patient Profile  ?   ?76 year old female with past medical history of coronary artery disease, cardiomyopathy improved on most recent echocardiogram, paroxysmal atrial fibrillation, COPD, hypertension, hyperlipidemia, history of intracranial bleed for evaluation of atrial fibrillation.  Patient had PCI October 2022 in November 2022.  She is visiting from [place].  Patient admitted with URI symptoms and she also developed transient atrial fibrillation and cardiology now asked to evaluate.  Echocardiogram shows normal LV function, biatrial enlargement and trace aortic insufficiency. ? ?Assessment & Plan  ?  ?1 paroxysmal atrial fibrillation-patient back in sinus rhythm today.  We will change Cardizem to 120 mg daily.   Change beta-blocker to Toprol 100 mg daily.  Continue apixaban.  As outlined in previous notes atrial fibrillation may be driven by pneumonia.  Her LV function is normal.   ?  ?2 acute on chronic diastolic congestive heart failure-patient remains euvolemic on examination.  Would not diurese. ?  ?3 COPD/pneumonia-antibiotics and pulmonary toilet per primary service. ?  ?4 hypertension-patient's blood pressure is controlled.  Continue present medications and follow. ?  ?5 coronary artery disease-continue Plavix and statin.  She is status post PCI October 2022. ?  ?6 hyperlipidemia-continue statin. ?  ?7 lung nodule-will need follow-up with primary care after discharge. ? ?Patient can be discharged from a cardiac standpoint.  We will sign off.  Please call with questions.  She should continue her present medications at discharge.  Note amlodipine and valsartan have been discontinued (valsartan can be resumed as an outpatient if her blood pressure increases).  Cardizem has been added.  She should follow-up with her cardiologist in November 2022. ? ? ?For questions or updates, please contact [facility] ?Please consult [email or web address] for contact info under  ? ?  ?   ?Signed, ?[place], MD  ?05/29/2021, 7:47 AM   ? ?

## 2021-05-29 NOTE — Care Management Important Message (Signed)
Important Message ? ?Patient Details  ?Name: Jill Baldwin ?MRN: [id number] ?Date of Birth: August 18, 1945 ? ? ?Medicare Important Message Given:  Yes ? ? ? ? ?Jill Baldwin ?05/29/2021, 2:32 PM ?

## 2021-05-29 NOTE — Progress Notes (Signed)
SATURATION QUALIFICATIONS: (This note is used to comply with regulatory documentation for home oxygen) ? ?Patient Saturations on Room Air at Rest = 79% ? ?Patient Saturations on Room Air while Ambulating = NT due to low SpO2 at rest on RA ? ?Patient Saturations on 4 Liters of oxygen while Ambulating = 89% ? ?Please briefly explain why patient needs home oxygen:Needs supplemental O2 at rest and with activity to maintain adequate oxygenation. ? ?[facility] PT ?Acute Rehabilitation Services ?Office [phone] ? ? ?

## 2021-05-29 NOTE — Discharge Summary (Signed)
? ?PATIENT DETAILS ?Name: Jill SchickGail Chermak ?Age: 76 y.o. ?Sex: female ?Date of Birth: 1945/12/02 ?MRN: [id number]. ?Admitting Physician: Maretta BeesShanker M Robert Sunga, MD ?NWG:NFAOZHPCP:System, Provider Not In ? ?Admit Date: 05/25/2021 ?Discharge date: 05/29/2021 ? ?Recommendations for Outpatient Follow-up:  ?Follow up with PCP in 1-2 weeks ?Please obtain CMP/CBC in one week ?Reassess if home O2 still required at next visit. ?Amlodipine/losartan on hold-Cardizem has been added.  Resume losartan when BP permits. ?3 mm left upper lung nodule-needs further surveillance scans per discretion of PCP. ? ?Admitted From:  ?Home ? ?Disposition: ?Home ?  ?Discharge Condition: ?fair ? ?CODE STATUS: ?  Code Status: Full Code  ? ?Diet recommendation:  ?Diet Order   ? ?       ?  Diet - low sodium heart healthy       ?  ?  Diet Heart Room service appropriate? Yes; Fluid consistency: Thin  Diet effective now       ?  ? ?  ?  ? ?  ?  ? ?Brief Summary: ?Patient is a 76 y.o.  female with history of CAD s/p PCI to RCA October 2022, PAF on Eliquis-visiting [place] in [place] (from [place])-presenting to the hospital with several days history of cough/nasal congestion (thought she had allergies) leading to SOB for the past few days.She was found to have acute hypoxic respiratory failure-thought to be due to combination of PNA, A-fib RVR and HFpEF with exacerbation. ?  ?Significant events: ?4/30>> admit to [facility]-presented with hypoxia (12 L of HFNC), A-fib RVR-HFpEF exacerbation and PNA.  Started on antibiotics/Cardizem gtt. ?5/1>> back in sinus rhythm ?5/3>> A-fib RVR-back on Cardizem infusion. ?  ?Significant studies: ?5/1>> CT chest: PNA right middle lobe, lingula and bibasilar infiltrates. ?5/1>> Echo: EF 55-60% ?  ?Significant microbiology data: ?4/30>> urine culture: Proteus mirabilis (likely a colonization) ?5/1>> COVID/influenza PCR: Negative ?5/1>> blood culture: Negative ?  ?Procedures: ?None ?  ?Consults: ?Cardiology ? ?Brief Hospital Course: ?Acute hypoxic respiratory  failure: Likely PNA as the main culprit with some contribution from HFpEF exacerbation.  Significantly improved-requiring just around 2-3 L of oxygen to maintain O2 saturation.  She has COPD at baseline as well.  Suspect will require short-term home O2 on discharge-her primary care practitioner will need to reassess at next visit whether she still requires home O2.   ?  ?CAP: Improving-afebrile/leukocytosis downtrending-cultures negative so far-treated with Rocephin/Zithromax-we will transition to oral antibiotics for a few more days. ?  ?PAF with RVR: Had episodes of RVR-cardiology followed closely-required Cardizem infusion-maintaining sinus rhythm since yesterday-Per cardiology-to continue current dosing of beta-blocker and Cardizem.  Remains on Eliquis.  Please ensure patient follows with her primary cardiologist on discharge.  ?  ?HFpEF exacerbation: Volume status has stabilized-likely triggered by A-fib RVR.  Did require a few doses of IV Lasix. ?  ?History of CAD-s/p PCI to RCA October 2022: No chest pain-troponins negative-continue Plavix/statin/beta-blocker. ?  ?Asymptomatic bacteriuria: No signs of UTI-urine culture positive for Proteus-sensitive to Rocephin-in any event-on antibiotics that should cover. ?  ?HTN: BP stable-on beta-blocker and Cardizem-losartan/amlodipine remains on hold.  PCP/primary cardiologist to reassess whether losartan can be resumed at a later date.   ?  ?COPD: Not in exacerbation-continue as needed bronchodilators.  Apparently has home O2 since she was discharged from the hospital after non-STEMI in October 2022-she is not using it. ?  ?3 mm noncalcified left upper lobe lung nodule: Prior history of smoking-we will need outpatient surveillance CT scan.  Incidental finding seen on CT chest.  Findings was discussed with patient and also with the patient's daughter. ?  ?BMI: ?Estimated body mass index is 21.77 kg/m? as calculated from the following: ?  Height as of this encounter: 5'  2" (1.575 m). ?  Weight as of this encounter: 54 kg.  ? ?Discharge Diagnoses:  ?Principal Problem: ?  Acute respiratory failure with hypoxia (HCC) ?Active Problems: ?  Paroxysmal atrial fibrillation with RVR (HCC) ?  CAD S/P percutaneous coronary angioplasty ?  HTN (hypertension) ?  Acute hypoxemic respiratory failure (HCC) ? ? ?Discharge Instructions: ? ?Activity:  ?As tolerated with Full fall precautions use walker/cane & assistance as needed ? ?Discharge Instructions   ? ? (HEART FAILURE PATIENTS) Call MD:  Anytime you have any of the following symptoms: 1) 3 pound weight gain in 24 hours or 5 pounds in 1 week 2) shortness of breath, with or without a dry hacking cough 3) swelling in the hands, feet or stomach 4) if you have to sleep on extra pillows at night in order to breathe.   Complete by: As directed ?  ? Call MD for:  difficulty breathing, headache or visual disturbances   Complete by: As directed ?  ? Diet - low sodium heart healthy   Complete by: As directed ?  ? Discharge instructions   Complete by: As directed ?  ? Follow with Primary MD in 1-2 weeks ? ?Amlodipine/losartan-(blood pressure medications) are currently on hold-please ask your primary care practitioner whether these can be resumed.  You has been started on a new medication-Cardizem for your heart rate but will also lower your blood pressure as well. ? ?Incidental finding-small 3 mm lung nodule was seen in the left upper lobe.  Please ask your primary care practitioner to follow-up with periodic CT scans.  In some cases these can turn cancerous. ? ?Use home O2 24/7-please ask your primary care practitioner to reassess at next visit whether you still require home O2. ? ?Please get a complete blood count and chemistry panel checked by your Primary MD at your next visit, and again as instructed by your Primary MD. ? ?Get Medicines reviewed and adjusted: ?Please take all your medications with you for your next visit with your Primary  MD ? ?Laboratory/radiological data: ?Please request your Primary MD to go over all hospital tests and procedure/radiological results at the follow up, please ask your Primary MD to get all Hospital records sent to his/her office. ? ?In some cases, they will be blood work, cultures and biopsy results pending at the time of your discharge. Please request that your primary care M.D. follows up on these results. ? ?Also Note the following: ?If you experience worsening of your admission symptoms, develop shortness of breath, life threatening emergency, suicidal or homicidal thoughts you must seek medical attention immediately by calling 911 or calling your MD immediately  if symptoms less severe. ? ?You must read complete instructions/literature along with all the possible adverse reactions/side effects for all the Medicines you take and that have been prescribed to you. Take any new Medicines after you have completely understood and accpet all the possible adverse reactions/side effects.  ? ?Do not drive when taking Pain medications or sleeping medications (Benzodaizepines) ? ?Do not take more than prescribed Pain, Sleep and Anxiety Medications. It is not advisable to combine anxiety,sleep and pain medications without talking with your primary care practitioner ? ?Special Instructions: If you have smoked or chewed Tobacco  in the last 2 yrs please stop smoking, stop  any regular Alcohol  and or any Recreational drug use. ? ?Wear Seat belts while driving. ? ?Please note: ?You were cared for by a hospitalist during your hospital stay. Once you are discharged, your primary care physician will handle any further medical issues. Please note that NO REFILLS for any discharge medications will be authorized once you are discharged, as it is imperative that you return to your primary care physician (or establish a relationship with a primary care physician if you do not have one) for your post hospital discharge needs so that  they can reassess your need for medications and monitor your lab values.  ? Increase activity slowly   Complete by: As directed ?  ? ?  ? ?Allergies as of 05/29/2021   ? ?   Reactions  ? Ticagrelor Shortness Of Breat

## 2021-05-31 LAB — CULTURE, BLOOD (ROUTINE X 2)
Culture: NO GROWTH
Special Requests: ADEQUATE

## 2022-10-12 IMAGING — DX DG CHEST 1V PORT
1 series · 1 of 1 positions shown · non-contrast
Comparison: None

CLINICAL DATA: A 75-year-old female presents with shortness of
breath.

EXAM:
PORTABLE CHEST 1 VIEW

[chest ap]
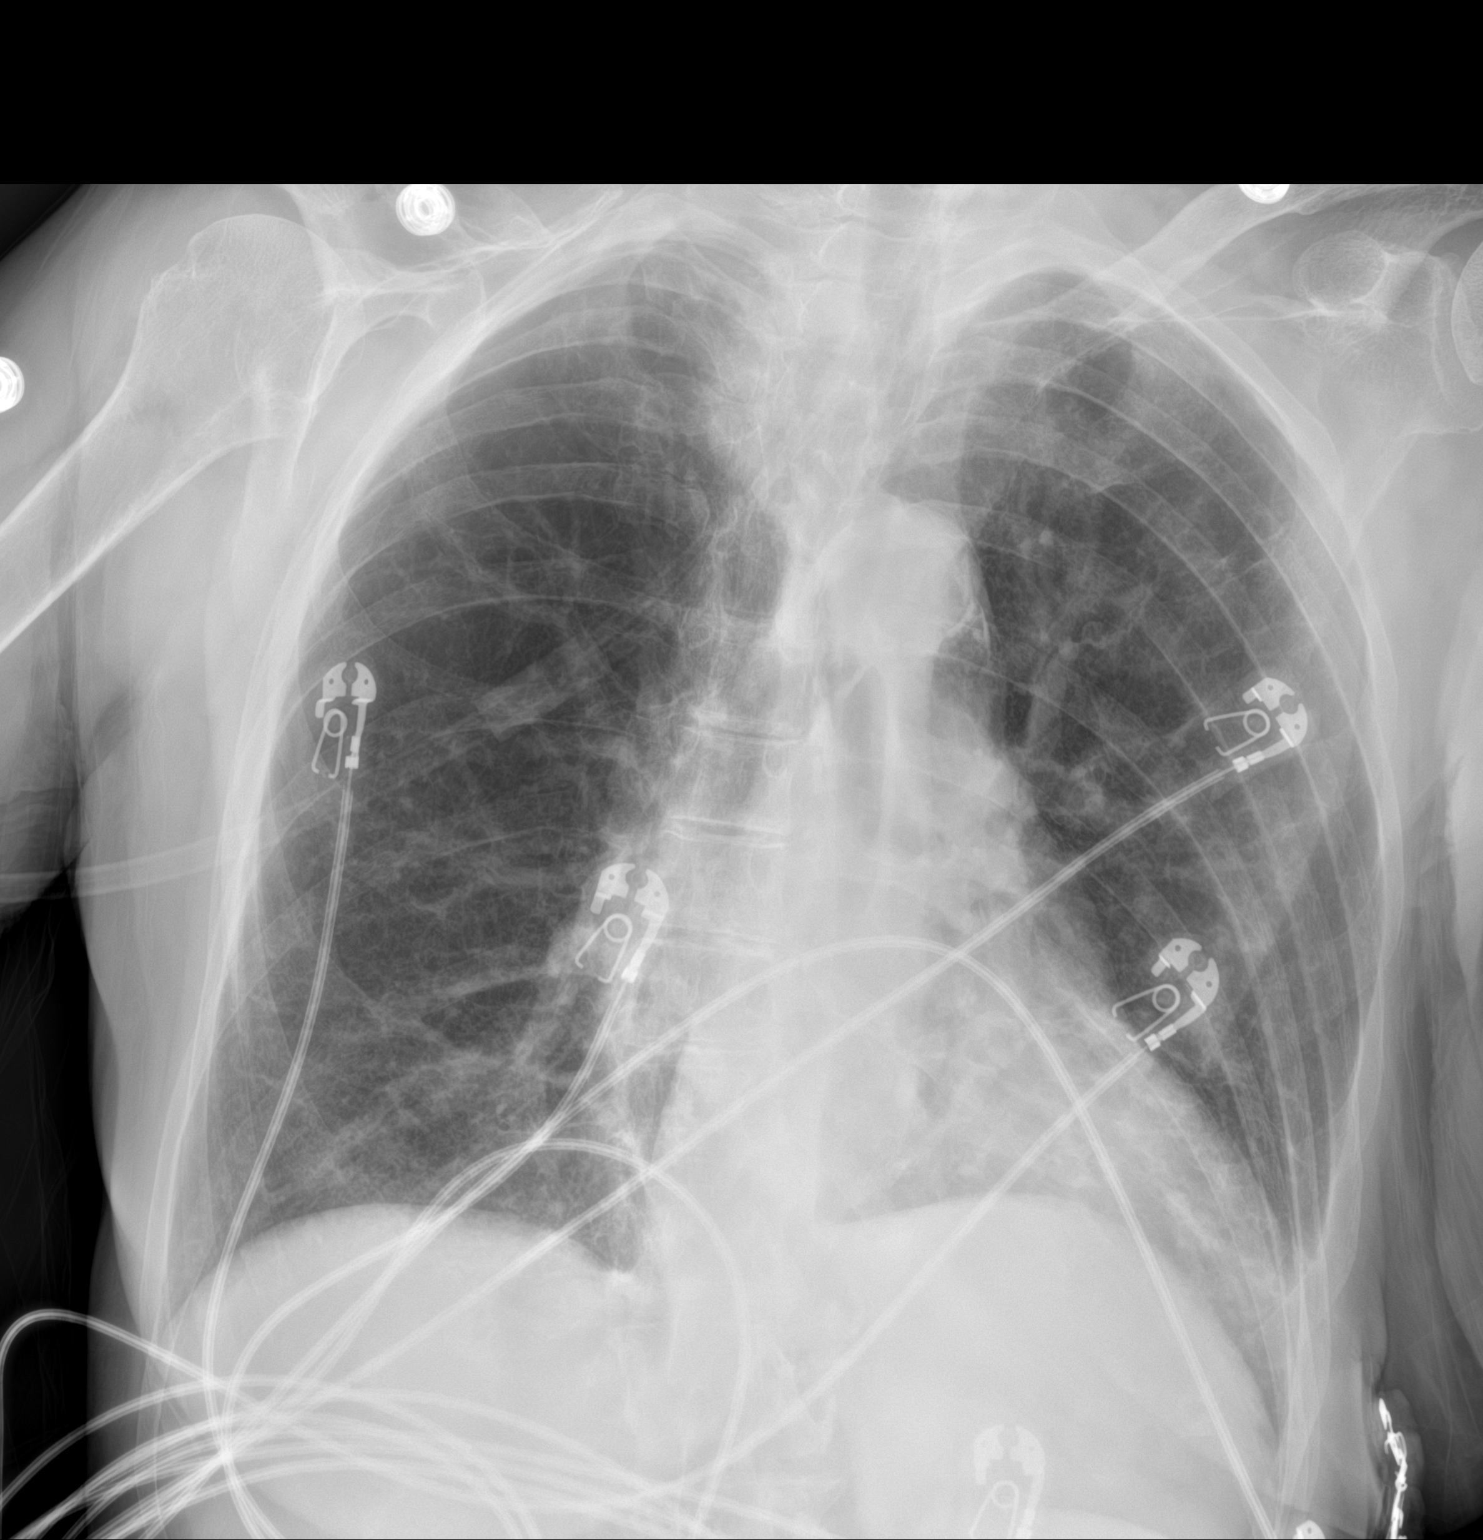

[1 of 1 positions shown; findings below may reference images not displayed]

FINDINGS: EKG leads project over the chest.

Cardiomediastinal contours and hilar structures suggest mild cardiac
enlargement and central pulmonary vascular engorgement.

Lungs are hyperinflated mildly. No lobar consolidation. Suggestion
of background pulmonary emphysema. Linear airspace disease at the
LEFT lung base. No pneumothorax.

On limited assessment there is no acute skeletal process. There is
near complete absence of the RIGHT clavicle which is likely related
to prior trauma. This is not well assessed.
IMPRESSION: 1. Mild cardiac enlargement with central pulmonary vascular
engorgement and probable background pulmonary emphysema.
2. Linear airspace disease at the LEFT lung base may represent
atelectasis or scarring. No lobar consolidation.
3. Near complete absence of the RIGHT clavicle is likely related to
prior trauma. Correlate with prior history of injury to this area.

## 2022-10-13 IMAGING — CT CT CHEST W/O CM
2 of 3 series · 15 of 36 positions shown, 18 images · non-contrast
Comparison: None.

CLINICAL DATA: Shortness of breath.



[Series 3: chest w/o 2mm st · axial · non-contrast · 0.69mm/px · z∈[+1087,+1383]mm · 12 of 174 slices shown, 15 images]
[im 13/174  mediastinal]
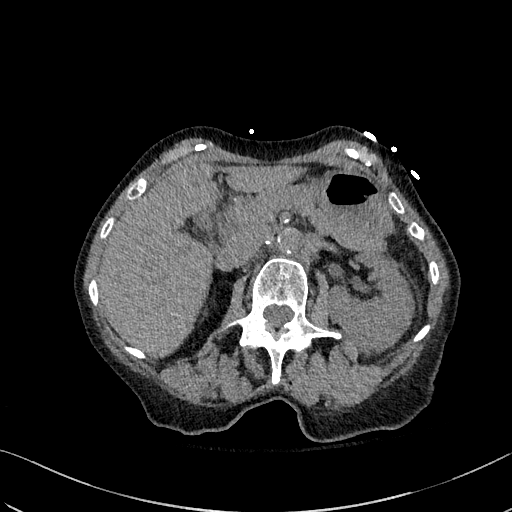
[im 13/174  lung]
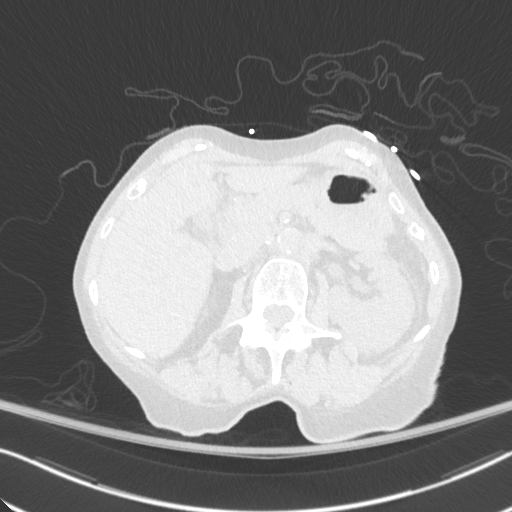
[im 26/174  lung]
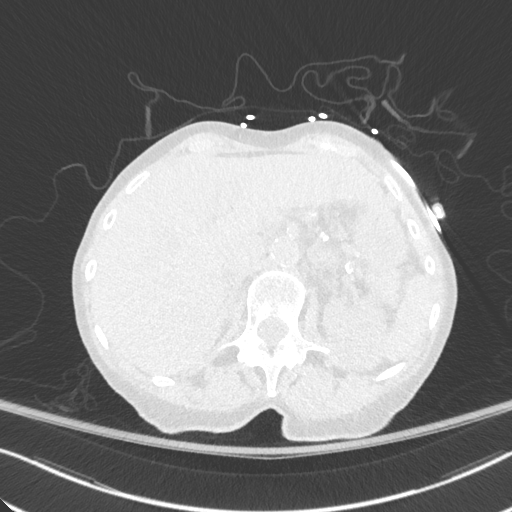
[im 39/174  lung]
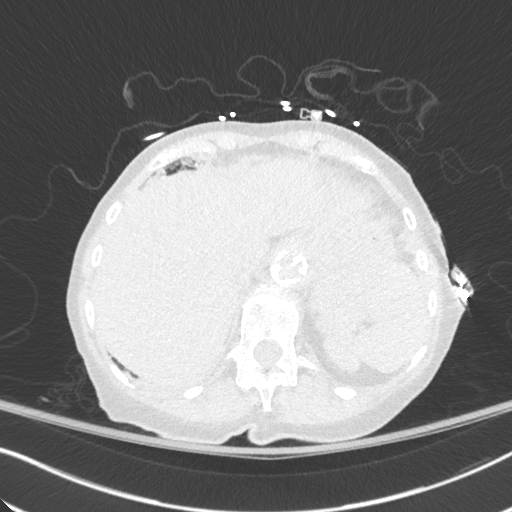
[im 52/174  lung]
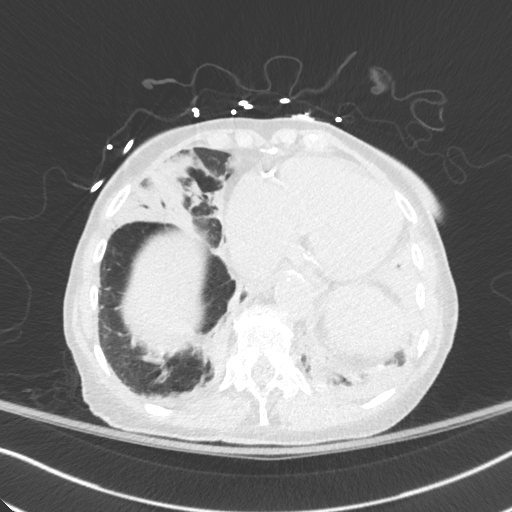
[im 65/174  mediastinal]
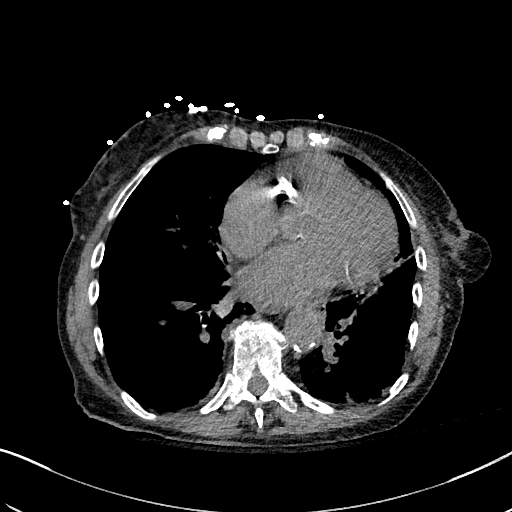
[im 65/174  lung]
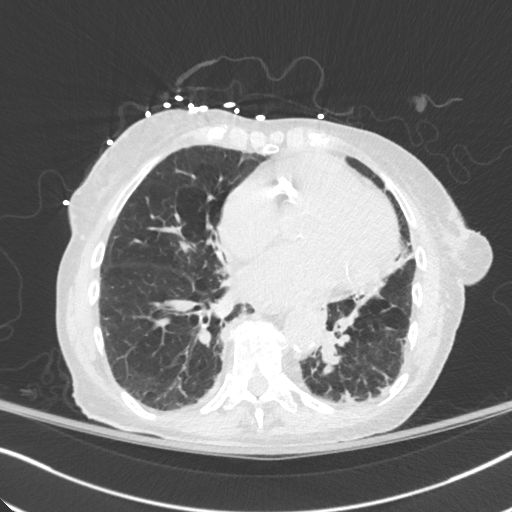
[im 77/174  lung]
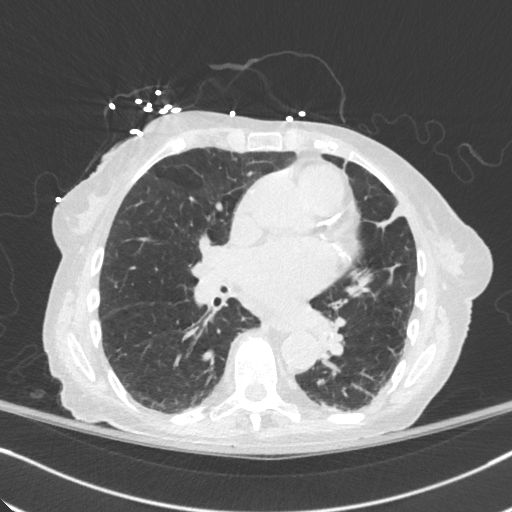
[im 97/174  lung]
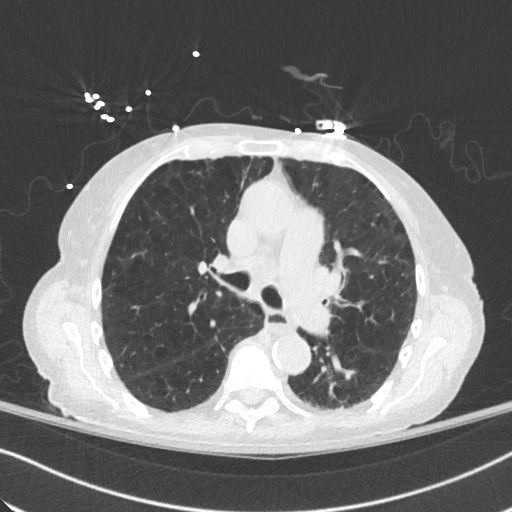
[im 109/174  lung]
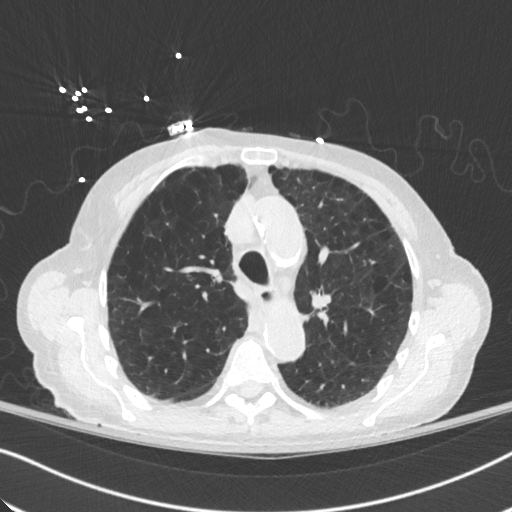
[im 122/174  mediastinal]
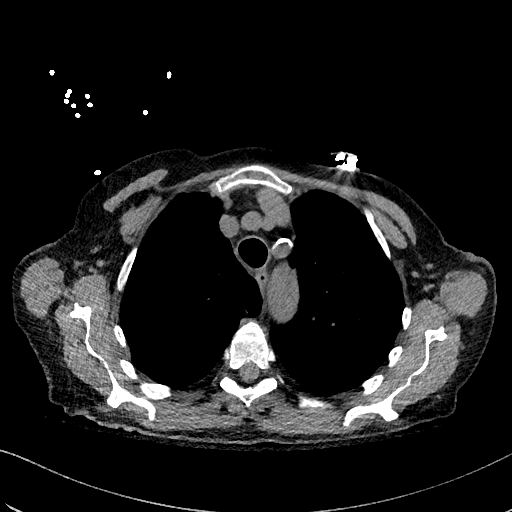
[im 122/174  lung]
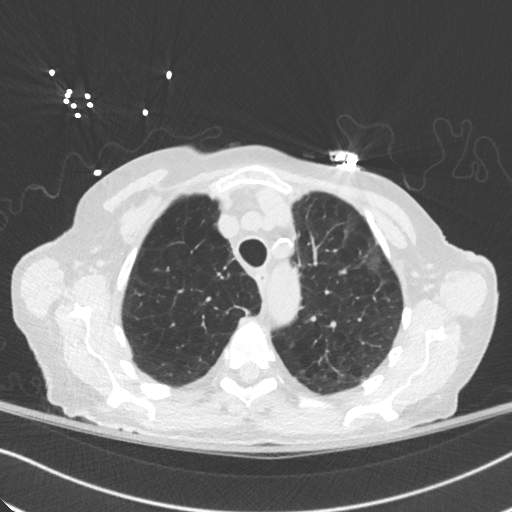
[im 135/174  lung]
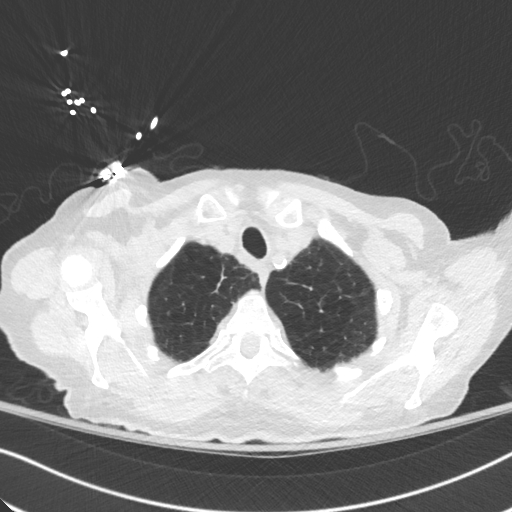
[im 148/174  lung]
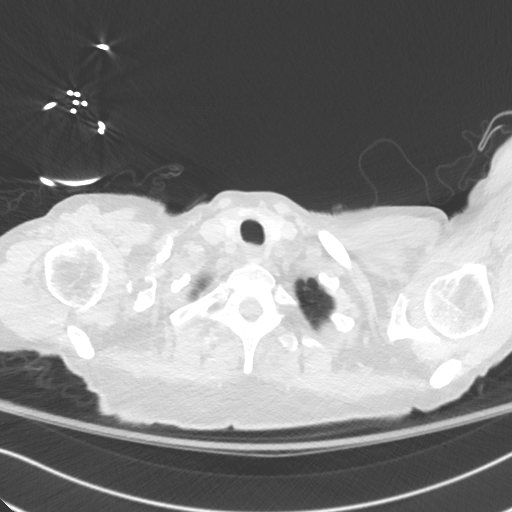
[im 161/174  lung]
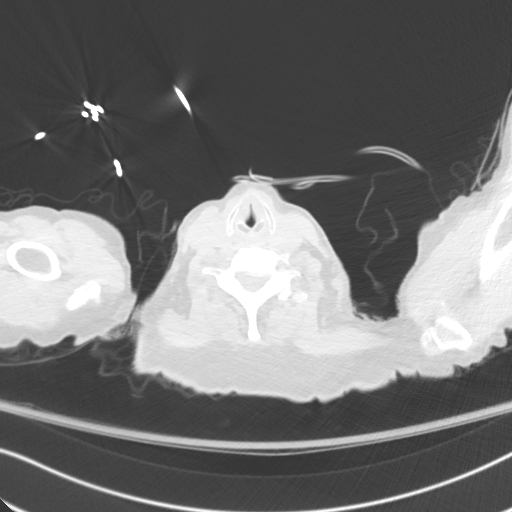

[Series 6: chest w/o 2mm st cor · coronal · non-contrast · 0.68mm/px · 3 of 131 slices shown]
[im 27/131  lung]
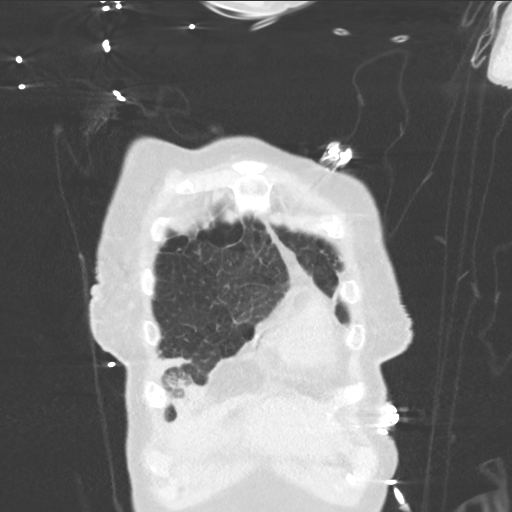
[im 53/131  lung]
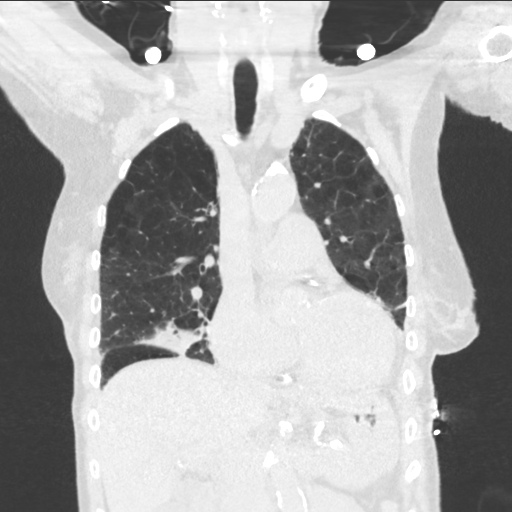
[im 79/131  lung]
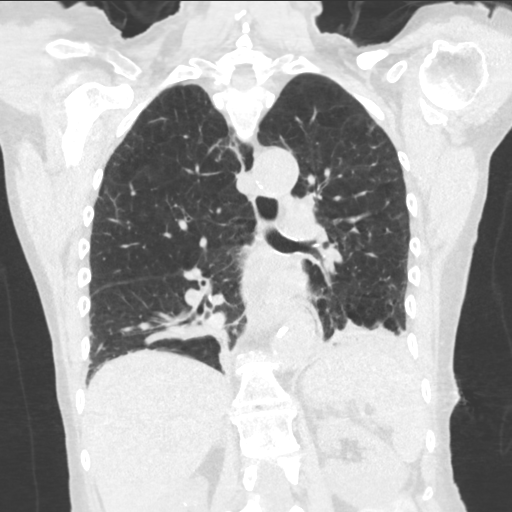

[15 of 36 positions shown; findings below may reference images not displayed]

FINDINGS: Cardiovascular: There is marked severity calcification of the
thoracic aorta, without evidence of aortic aneurysm. There is mild
cardiomegaly with marked severity coronary artery calcification. No
pericardial effusion.

Mediastinum/Nodes: There is mild AP window, pretracheal and
subcarinal lymphadenopathy. Thyroid gland, trachea, and esophagus
demonstrate no significant findings.

Lungs/Pleura: There is extensive emphysematous lung disease with
mild linear scarring and/or atelectasis is seen within the right
apex.

There is a 3 mm noncalcified lateral left upper lobe lung nodule
(axial CT image 45, CT series 4).

Moderate to marked severity areas of atelectasis and/or infiltrate
are seen within the right middle lobe, lingular region and bilateral
lung bases.

There is no evidence of a pleural effusion or pneumothorax.

Upper Abdomen: No acute abnormality.

Musculoskeletal: Multilevel degenerative changes are seen throughout
the thoracic spine.
IMPRESSION: 1. Moderate to marked severity right middle lobe, lingular and
bibasilar atelectasis and/or infiltrate.
2. 3 mm noncalcified lateral left upper lobe lung nodule. No
follow-up needed if patient is low-risk.This recommendation follows
the consensus statement: Guidelines for Management of Incidental
Pulmonary Nodules Detected on CT Images: From the [HOSPITAL]
3. Mild cardiomegaly with marked severity coronary artery
calcification.

Aortic Atherosclerosis ([id number]-[id number]).
